# Patient Record
Sex: Female | Born: 1990 | Race: Black or African American | Hispanic: No | Marital: Single | Smoking: Former smoker
Health system: Southern US, Community
[De-identification: ages and names within clinical notes are randomized; demographics above are authoritative.]

## PROBLEM LIST (undated history)

## (undated) ENCOUNTER — Inpatient Hospital Stay (HOSPITAL_COMMUNITY): Payer: Self-pay

## (undated) DIAGNOSIS — F32A Depression, unspecified: Secondary | ICD-10-CM

## (undated) DIAGNOSIS — G8929 Other chronic pain: Secondary | ICD-10-CM

## (undated) DIAGNOSIS — M549 Dorsalgia, unspecified: Secondary | ICD-10-CM

## (undated) DIAGNOSIS — F319 Bipolar disorder, unspecified: Secondary | ICD-10-CM

## (undated) DIAGNOSIS — F329 Major depressive disorder, single episode, unspecified: Secondary | ICD-10-CM

## (undated) HISTORY — PX: TONSILLECTOMY: SUR1361

## (undated) HISTORY — PX: OTHER SURGICAL HISTORY: SHX169

---
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
## 2009-08-27 ENCOUNTER — Emergency Department (HOSPITAL_COMMUNITY): Admission: EM | Admit: 2009-08-27 | Discharge: 2009-08-27 | Payer: Self-pay | Admitting: Emergency Medicine

## 2009-10-27 ENCOUNTER — Emergency Department (HOSPITAL_COMMUNITY): Admission: EM | Admit: 2009-10-27 | Discharge: 2009-10-27 | Payer: Self-pay | Admitting: Emergency Medicine

## 2009-11-08 ENCOUNTER — Other Ambulatory Visit: Admission: RE | Admit: 2009-11-08 | Discharge: 2009-11-08 | Payer: Self-pay | Admitting: Obstetrics and Gynecology

## 2010-02-18 ENCOUNTER — Inpatient Hospital Stay (HOSPITAL_COMMUNITY): Admission: AD | Admit: 2010-02-18 | Discharge: 2010-02-18 | Payer: Self-pay | Admitting: Obstetrics & Gynecology

## 2010-04-04 ENCOUNTER — Inpatient Hospital Stay (HOSPITAL_COMMUNITY): Admission: RE | Admit: 2010-04-04 | Discharge: 2010-04-07 | Payer: Self-pay | Admitting: Obstetrics and Gynecology

## 2010-06-19 ENCOUNTER — Encounter: Payer: Self-pay | Admitting: Obstetrics

## 2010-08-09 LAB — CBC
HCT: 26.1 % — ABNORMAL LOW (ref 36.0–46.0)
Hemoglobin: 8.7 g/dL — ABNORMAL LOW (ref 12.0–15.0)
MCH: 27.9 pg (ref 26.0–34.0)
MCHC: 33.3 g/dL (ref 30.0–36.0)
MCV: 83.8 fL (ref 78.0–100.0)
Platelets: 148 10*3/uL — ABNORMAL LOW (ref 150–400)
RBC: 3.12 MIL/uL — ABNORMAL LOW (ref 3.87–5.11)
RDW: 14.6 % (ref 11.5–15.5)
WBC: 8.9 10*3/uL (ref 4.0–10.5)

## 2010-08-09 LAB — TYPE AND SCREEN
ABO/RH(D): A POS
Antibody Screen: NEGATIVE

## 2010-08-09 LAB — ABO/RH: ABO/RH(D): A POS

## 2010-08-10 LAB — CBC
HCT: 33.7 % — ABNORMAL LOW (ref 36.0–46.0)
Hemoglobin: 11.1 g/dL — ABNORMAL LOW (ref 12.0–15.0)
MCH: 27.6 pg (ref 26.0–34.0)
MCHC: 33 g/dL (ref 30.0–36.0)
MCV: 83.5 fL (ref 78.0–100.0)
Platelets: 187 10*3/uL (ref 150–400)
RBC: 4.03 MIL/uL (ref 3.87–5.11)
RDW: 13.9 % (ref 11.5–15.5)
WBC: 7 10*3/uL (ref 4.0–10.5)

## 2010-08-10 LAB — SURGICAL PCR SCREEN
MRSA, PCR: NEGATIVE
Staphylococcus aureus: NEGATIVE

## 2010-08-10 LAB — RPR: RPR Ser Ql: NONREACTIVE

## 2010-08-11 LAB — URINALYSIS, ROUTINE W REFLEX MICROSCOPIC
Bilirubin Urine: NEGATIVE
Glucose, UA: 100 mg/dL — AB
Hgb urine dipstick: NEGATIVE
Ketones, ur: 15 mg/dL — AB
Nitrite: NEGATIVE
Protein, ur: NEGATIVE mg/dL
Specific Gravity, Urine: >1.03 — ABNORMAL HIGH (ref 1.005–1.030)
Urobilinogen, UA: 2 mg/dL — ABNORMAL HIGH (ref 0.0–1.0)
pH: 6 (ref 5.0–8.0)

## 2010-08-11 LAB — URINE MICROSCOPIC-ADD ON

## 2010-08-11 LAB — GC/CHLAMYDIA PROBE AMP, GENITAL
Chlamydia, DNA Probe: NEGATIVE
GC Probe Amp, Genital: NEGATIVE

## 2010-08-11 LAB — FETAL FIBRONECTIN: Fetal Fibronectin: NEGATIVE

## 2010-08-11 LAB — WET PREP, GENITAL
Clue Cells Wet Prep HPF POC: NONE SEEN
Trich, Wet Prep: NONE SEEN
Yeast Wet Prep HPF POC: NONE SEEN

## 2010-08-15 LAB — URINE MICROSCOPIC-ADD ON

## 2010-08-15 LAB — CBC
HCT: 34.5 % — ABNORMAL LOW (ref 36.0–46.0)
Hemoglobin: 11.5 g/dL — ABNORMAL LOW (ref 12.0–15.0)
MCHC: 33.3 g/dL (ref 30.0–36.0)
MCV: 85.6 fL (ref 78.0–100.0)
Platelets: 216 10*3/uL (ref 150–400)
RBC: 4.03 MIL/uL (ref 3.87–5.11)
RDW: 13.3 % (ref 11.5–15.5)
WBC: 6.4 10*3/uL (ref 4.0–10.5)

## 2010-08-15 LAB — DIFFERENTIAL
Basophils Absolute: 0 10*3/uL (ref 0.0–0.1)
Basophils Relative: 1 % (ref 0–1)
Eosinophils Absolute: 0.3 10*3/uL (ref 0.0–0.7)
Eosinophils Relative: 4 % (ref 0–5)
Lymphocytes Relative: 26 % (ref 12–46)
Lymphs Abs: 1.7 10*3/uL (ref 0.7–4.0)
Monocytes Absolute: 0.4 10*3/uL (ref 0.1–1.0)
Monocytes Relative: 6 % (ref 3–12)
Neutro Abs: 4.1 10*3/uL (ref 1.7–7.7)
Neutrophils Relative %: 64 % (ref 43–77)

## 2010-08-15 LAB — POCT PREGNANCY, URINE: Preg Test, Ur: POSITIVE

## 2010-08-15 LAB — COMPREHENSIVE METABOLIC PANEL
ALT: 15 U/L (ref 0–35)
AST: 19 U/L (ref 0–37)
Albumin: 3.2 g/dL — ABNORMAL LOW (ref 3.5–5.2)
Alkaline Phosphatase: 49 U/L (ref 39–117)
BUN: 8 mg/dL (ref 6–23)
CO2: 25 mEq/L (ref 19–32)
Calcium: 9.1 mg/dL (ref 8.4–10.5)
Chloride: 108 mEq/L (ref 96–112)
Creatinine, Ser: 0.59 mg/dL (ref 0.4–1.2)
GFR calc Af Amer: >60 mL/min (ref >60)
GFR calc non Af Amer: >60 mL/min (ref >60)
Glucose, Bld: 86 mg/dL (ref 70–99)
Potassium: 3.9 mEq/L (ref 3.5–5.1)
Sodium: 138 mEq/L (ref 135–145)
Total Bilirubin: 0.6 mg/dL (ref 0.3–1.2)
Total Protein: 6.3 g/dL (ref 6.0–8.3)

## 2010-08-15 LAB — URINALYSIS, ROUTINE W REFLEX MICROSCOPIC
Bilirubin Urine: NEGATIVE
Glucose, UA: NEGATIVE mg/dL
Ketones, ur: NEGATIVE mg/dL
Leukocytes, UA: NEGATIVE
Nitrite: NEGATIVE
Protein, ur: NEGATIVE mg/dL
Specific Gravity, Urine: 1.03 (ref 1.005–1.030)
Urobilinogen, UA: 0.2 mg/dL (ref 0.0–1.0)
pH: 5.5 (ref 5.0–8.0)

## 2010-08-15 LAB — HCG, QUANTITATIVE, PREGNANCY: hCG, Beta Chain, Quant, S: 17526 m[IU]/mL — ABNORMAL HIGH (ref <5)

## 2010-08-15 LAB — URINE CULTURE: Colony Count: >=100000

## 2010-08-15 LAB — LIPASE, BLOOD: Lipase: 25 U/L (ref 11–59)

## 2010-08-15 LAB — ABO/RH: ABO/RH(D): A POS

## 2010-08-15 LAB — GC/CHLAMYDIA PROBE AMP, GENITAL
Chlamydia, DNA Probe: NEGATIVE
GC Probe Amp, Genital: NEGATIVE

## 2010-10-11 NOTE — Discharge Summary (Signed)
NAMEDESSIRE, GRIMES NO.:  [id number]   MEDICAL RECORD NO.:  [id number]          PATIENT TYPE:  INP   LOCATION:  9318                          FACILITY:  [facility]   PHYSICIAN:  Tanya S. Shawnie Pons, M.D.   DATE OF BIRTH:  [birth year]   DATE OF ADMISSION:  01/29/2008  DATE OF DISCHARGE:  02/01/2008                               DISCHARGE SUMMARY   FINAL DIAGNOSES:  1. Preterm labor.  2. Chorioamnionitis.  3. Postpartum endometritis.  4. Anemia.   PERTINENT PROCEDURES:  Repeat low transverse cesarean section secondary  to nonreassuring fetal heart rate tracing and chorioamnionitis.   PERTINENT LABORATORY:  Preoperative hemoglobin of 10.4 and postoperative  hemoglobin of 8.7 home.  RPR is nonreactive.   REASON FOR ADMISSION:  Briefly, the patient is an 20 year old gravida 2,  para 1 had previous C-section for arrest of dilation who was for a total  lack who was admitted at 35 weeks' gestation with preterm labor.   HOSPITAL COURSE:  The patient was admitted to labor and delivery.  She  develops chorioamnionitis relatively soon after this with fever of 102.  The patient was 6 cm, 90% effaced and -1 at the time.  The patient had  repetitive severe variable decelerations was remote from delivery and  was taken for a repeat C-section.  C-section was uneventful and the  patient delivered a viable female infant that was taken to the NICU  secondary to prematurity and fever during labor.  Postoperatively, she  was transferred to the third floor where she had an uneventful  postoperative course.  Because the patient fever, she was continued on  antibiotics and she had a low grade temperature of 100.1 on  postoperative day #1, but was afebrile for 24 hours prior to discharge.  The patient was breast pumping and was doing well with that.  She was  ambulating, voiding without difficulty.  She was tolerating p.o. and had  passed flatus.  Because the patient was so stable so she was  stable for  discharge.   DISCHARGE DISPOSITION AND CONDITION:  The patient is discharged home in  good condition.  Followup will be in 6 weeks with the health department.  She will use Depo-Provera for contraception.  She is status post this  shot prior to discharge.   DISCHARGE MEDICATIONS:  1. Percocet 5/325 1-2 p.o. q.4-6 hours p.r.n. pain #40.  2. Given ibuprofen 600 mg every 6 hours p.r.n. pain.  3. Colace 100 mg twice daily.  4. Iron sulfate 325 mg twice daily.  5. She will continue prenatal vitamins, though she has a problem.      Shelbie Proctor. Shawnie Pons, M.D.  Electronically Signed     TSP/MEDQ  D:  02/01/2008  T:  02/01/2008  Job:  [id number]

## 2010-10-11 NOTE — Op Note (Signed)
[name], BYE NO.:  [id number]   MEDICAL RECORD NO.:  [id number]           PATIENT TYPE:   LOCATION:                                 FACILITY:   PHYSICIAN:  Allie Bossier, MD        DATE OF BIRTH:  August 29, 1990   DATE OF PROCEDURE:  DATE OF DISCHARGE:                               OPERATIVE REPORT   PREOPERATIVE DIAGNOSES:  1. Thirty-four plus weeks active labor.  2. Nonreassuring fetal heart rate.  3. Remote from delivery.  4. Previous cesarean section.  5. Chorioamnionitis.   POSTOPERATIVE DIAGNOSES:  1. Thirty-four plus weeks active labor.  2. Nonreassuring fetal heart rate.  3. Remote from delivery.  4. Previous cesarean section.  5. Chorioamnionitis.   PROCEDURE:  Repeat low transverse cesarean section.   SURGEON:  Allie Bossier, MD   ANESTHESIA:  Epidural, Angelica Pou, MD   COMPLICATIONS:  None.   ESTIMATED BLOOD LOSS:  500 mL.   SPECIMENS:  Cord blood and placenta.   FINDINGS:  1. Living female infant with Apgars of 5 and 7 at one and five      minutes, weight pending.  2. Intact placenta with three-vessel cord.  3. Normal adnexa.  4. Loose nuchal cord.   DETAIL OF PROCEDURE AND FINDINGS:  Risks, benefits, and alternatives of  the surgery was explained, understood, and accepted, consents were  signed.  She was taken to the operating room where epidural was bolused  for surgery.  Her abdomen was prepped and draped in usual sterile  fashion.  She was placed in a dorsal supine position with left lateral  tilt.  After adequate anesthesia was assured, a transverse incision was  made at the site of her previous incision.  Incision was carried down  through the subcutaneous tissue to the fascia.  The fascia was scored in  midline.  There was a marked amount of scarring.  At this point,  carefully open the fascia and the rectus muscles were partially  separated in a transverse fashion using electrosurgical technique.  Peritoneum was  entered with hemostats.  Peritoneal incision was extended  bilaterally with the Bovie taking care to avoid the bladder.  The  bladder blade was placed.  The lower uterine segment was noted to be  intact and yet a very attenuated and bulging outwards.  Transverse  incision was made at this site.  Amniotomy was performed with hemostats,  slightly bloody fluid was noted.  Nuchal cord x1 was reduced.  A Kiwi  vacuum extractor was used to assist delivery of the head with one gentle  pull.  The mouth and nostrils were suctioned prior to delivery of the  shoulders.  The cord was clamped and cut and the baby was transferred to  the pediatrician for routine care.  The weight and Apgars are listed  above.      Allie Bossier, MD  Electronically Signed     MCD/MEDQ  D:  01/29/2008  T:  01/30/2008  Job:  [id number]

## 2010-10-11 NOTE — Op Note (Signed)
[name], [name] NO.:  [id number]   MEDICAL RECORD NO.:  [id number]          PATIENT TYPE:  INP   LOCATION:  9114                          FACILITY:  [facility]   PHYSICIAN:  Charles A. Clearance Coots, M.D.DATE OF BIRTH:  15-Jun-1990   DATE OF PROCEDURE:  04/18/2007  DATE OF DISCHARGE:                               OPERATIVE REPORT   PREOPERATIVE DIAGNOSIS:  Induction of early labor, late fetal heart rate  decelerations.   POSTOPERATIVE DIAGNOSIS:  Induction of early labor, late fetal heart  rate decelerations.   PROCEDURE:  Primary low transverse cesarean section.   SURGEON:  Charles A. Clearance Coots, M.D.   ASSISTANT:  Kathlee Nations, CST   ANESTHESIA:  Epidural.   ESTIMATED BLOOD LOSS:  700 mL.   IV FLUIDS:  650 mL.   URINE OUTPUT:  125 mL.   COMPLICATIONS:  None.   DRAINS:  Foley to gravity   SPECIMEN:  Placenta to pathology.   FINDINGS:  Viable female at 11:04, Apgars of 8 at 1 minute and 9 at 5  minutes, weight of 7 pounds 14 ounces.  Cord pH of 7.19.  Normal uterus,  ovaries, and fallopian tubes.   OPERATION:  The patient was brought to the operating room. After  satisfactory redosing of the epidural, the abdomen was prepped and  draped in the usual sterile fashion.  A Pfannenstiel skin incision was  made with the scalpel that was deepened down to the fascia with the  scalpel.  The fascia was nicked in the midline and the fascial incision  was extended to the left and to the right with curved Mayo scissors.  The superior and inferior fascial edges were taken off of the rectus  muscles using blunt and sharp dissection.  The rectus muscles were  bluntly and sharply divided in the midline.  The peritoneum was entered  digitally and was digitally extended to the left and to the right.  The  bladder blade was positioned and the vesicouterine fold of peritoneum  above the reflection of the urinary bladder was grasped with forceps and  was incised and  undermined with Metzenbaum scissors.  The incision was  extended to the left and to the right with Metzenbaum scissors.  The  bladder flap was developed and the bladder blade was repositioned in  front of the urinary bladder placing it well out of the operative field.  The uterus was then entered transversely in the lower uterine segment  with a scalpel.  Clear amniotic fluid was expelled.  The uterine  incision was extended to the left and to the right digitally and the  vertex was noted to be left occiput transverse.  The occiput was rotated  into the incision and was not able to flex adequately and vacuum  extraction with the Mityvac mushroom vacuum was applied to the occiput  and the occiput was then flexed and the vertex was delivered with the  aid of fundal pressure from the assistant and vacuum extraction with  just two pulls.  The infant's mouth and nose were suctioned with a  suction bulb and delivery was completed with  the aid of fundal pressure  from the assistant.  The umbilical cord was doubly clamped and cut and  the infant was handed off to the nursery staff.  The placenta was then  spontaneously expelled from the uterine cavity intact.  The endometrial  surface was thoroughly debrided with a dry lap sponge. The edges of the  uterine incision were grasped with ring forceps.  The uterus was closed  with a continuous interlocking suture of 0 Monocryl.  Hemostasis was  excellent. The bladder flap was closed with a continuous suture of 3-0  Monocryl.  The pelvic cavity was thoroughly irrigated with warm saline  solution.  All clots were removed.  The abdomen was then closed as  follows.  The peritoneum was closed with continuous suture of 2-0  Monocryl.  The fascia was closed with a continuous suture of 0 Vicryl.  The subcutaneous tissue was thoroughly irrigated with warm saline  solution.  All areas of subcutaneous bleeding were coagulated with the  Bovie.  The skin was then  closed with stainless steel staples.  A  sterile bandage was applied to the incision closure.  The surgical  technician indicated that all needle, sponge and instrument counts were  correct x2.  The patient tolerated the procedure well and was  transported to the recovery room in satisfactory condition.      Charles A. Clearance Coots, M.D.  Electronically Signed     CAH/MEDQ  D:  04/18/2007  T:  04/18/2007  Job:  [id number]

## 2010-10-14 NOTE — Discharge Summary (Signed)
[name], [name] NO.:  [id number]   MEDICAL RECORD NO.:  [id number]          PATIENT TYPE:  INP   LOCATION:  9114                          FACILITY:  [facility]   PHYSICIAN:  Roseanna Rainbow, M.D.DATE OF BIRTH:  1991/05/06   DATE OF ADMISSION:  04/17/2007  DATE OF DISCHARGE:  04/21/2007                               DISCHARGE SUMMARY   CHIEF COMPLAINT:  The patient is a 20 year old gravida 1 African-  American female with an estimated date of confinement of April 18, 2007, now complaining of decreased fetal movement.   HISTORY OF PRESENT ILLNESS:  Please see the above.  A nonstress test was  nonreactive.  On sterile vaginal exam, the Bishop score is favorable.  The patient also reported uterine contractions.   PAST SURGICAL HISTORY:  She denies.   PAST MEDICAL HISTORY:  She denies.   MEDICATIONS:  Prenatal vitamins.   SOCIAL HISTORY:  She denies any tobacco, ethanol or drug use.   FAMILY HISTORY:  Hypertension, diabetes.   PHYSICAL EXAMINATION:  VITAL SIGNS:  Blood pressure 120/78.  LUNGS:  Clear to auscultation.  HEART:  Regular rate and rhythm.  ABDOMEN:  Gravid, nontender.  GU:  Sterile vaginal exam, cervix is 2 cm dilated, 70% effaced with  vertex at -1 station.   ASSESSMENT/PLAN:  Intrauterine pregnancy at 39+ weeks with decreased  fetal movement and a nonreactive nonstress test, favorable Bishop score.   PLAN:  Admission, Pitocin, augmentation of labor.   HOSPITAL COURSE:  The patient was admitted.  She was started on Pitocin  per the low-dose protocol.  She progressed to 5 cm of dilatation at  which point there was a protraction of the active phase of labor.  The  fetal heart tracing had developed late decelerations.  The decision at  this point was made to proceed with a cesarean delivery.  Please see the  dictated operative summary.  On postoperative day 1, her hemoglobin 8.  She was hemodynamically stable.  The remainder of her  hospital course  was uneventful.  She was discharged to home on postoperative day 3.   DISCHARGE DIAGNOSIS:  Intrauterine pregnancy at term, suspicious fetal  heart tracing.   PROCEDURE:  Cesarean delivery.   CONDITION:  Good.   DIET:  Regular.   ACTIVITY:  Pelvic rest, progressive activity.   DISCHARGE MEDICATIONS:  Percocet.   FOLLOW UP:  The patient was to follow up in the office in 6 weeks.      Roseanna Rainbow, M.D.  Electronically Signed     LAJ/MEDQ  D:  05/16/2007  T:  05/16/2007  Job:  [id number]

## 2010-10-14 NOTE — Op Note (Signed)
NAME:  JOHNEISHA, BROADEN NO.:  [id number]   MEDICAL RECORD NO.:  [id number]                  PATIENT TYPE:   LOCATION:                                       FACILITY:   PHYSICIAN:  Karol T. Lazarus Salines, M.D.              DATE OF BIRTH:  05/09/1991   DATE OF PROCEDURE:  06/30/2002  DATE OF DISCHARGE:                                 OPERATIVE REPORT   PREOPERATIVE DIAGNOSES:  1. Obstructive tonsillar hypertrophy.  2. Obstructive turbinate hypertrophy bilaterally.   POSTOPERATIVE DIAGNOSES:  1. Obstructive tonsillar hypertrophy.  2. Obstructive turbinate hypertrophy bilaterally.  3. Obstructive adenoid hypertrophy.   PROCEDURE:  Tonsillectomy, adenoidectomy, bilateral submucous resection of  inferior turbinates.   SURGEON:  Gloris Manchester. Lazarus Salines, M.D.   ANESTHESIA:  General orotracheal.   ESTIMATED BLOOD LOSS:  25 cubic centimeters.   COMPLICATIONS:  None.   FINDINGS:  Tonsils 4+, normal soft palate, 80% obstructive adenoid pad,  large bony and soft tissue hypertrophy of the inferior turbinates with  obstruction bilaterally.   DESCRIPTION OF PROCEDURE:  With the patient in a comfortable supine  position, general orotracheal anesthesia was induced without difficulty.  At  an appropriate level, the patient was placed in a slight sitting position  and the table was rotated.  A saline-moistened throat pack was placed.  Cocaine 4% solution, 160 mg total was applied on a 1/2 x 3 cottonoids to  both sides of the nose around the inferior turbinates.  Xylocaine 1% with  1:100,000 epinephrine, 10 cubic centimeters total was infiltrated into the  submucosa of the inferior turbinates on both sides.  Several minutes were  allowed for hemostasis and anesthesia to take effect.  The throat was inspected using a Weder tongue retractor and 1/2% Xylocaine  with 1:200,000 epinephrine was infiltrated into the peritonsillar planes on  both sides, 10 cubic centimeters  total for intraoperative hemostasis.  After allowing adequate time, the materials were removed from the right side  of the nose and observed to be intact and correct in number.  The findings  were as described above.  The anterior hood of the inferior turbinate was  lysed just behind the nasal valve region.  The medial mucosa of the middle  turbinate was incised in an anterior up-sloping fashion, and a laterally-  based flap was developed.  The turbinate was in-fractured.  Using angled  turbinate scissors, the turbinate bone and lateral mucosa was excised in a  posterior down-sloping fashion, taking virtually all of the anterior pole  and leaving virtually all of the posterior pole.  The bone was very heavy  and was carefully dissected and submucosally resected.  The cut edges were  suction-coagulated.  The flap was laid back down.  The turbinate was out-  fractured and the right side was completed.  The left side was done in an  identical fashion.  Upon completion of both sides of the nose, the quadruple  thickness Neosporin-impregnated Telfa pack was placed for pressure for  hemostasis in each side of the nose.  This completed the nasal portion of  the procedure.  The patient was now placed in Trendelenburg.  The pharynx was suctioned free  and the throat pack was removed, taking care to protect lips, teeth, and  endotracheal tube.  The Crowe-Davis mouth gag was introduced, expanded for  visualization, and suspended from the Mayo stand in the standard fashion.  The blade was adjusted several times for adequate fit and finally a #4 flat  blade was used.  The findings were as described above.  The palate and  mirror were used to visualize the nasopharynx, with the findings as  described above.  The decision was made to include adenoidectomy.  The adenoids were removed from the nasopharynx using sharp adenoid curets  and several passes.  The tissue was carefully removed and the pharynx was   suctioned dry and packed with saline-moistened tonsillar sponges for  hemostasis.  Beginning on the left side, the tonsil was grasped and retracted medially.  The mucosa overlying the anterior and superior poles was coagulated and then  cut down to the capsule of the tonsil.  Using the cautery tip as a blunt  dissector, coagulating crossing vessels and lysing fibrous bands as  identified, the tonsil was dissected free of its muscular fossa from  superiorly downward.  The tonsil was removed in its entirety as determined  by examination of both tonsil and fossa.  A small additional quantity of  cautery rendered the fossa hemostatic.  After completing the left  tonsillectomy, the right side was done in an identical fashion.  After completing both tonsillectomies and rendering the oropharynx  hemostatic, the nasopharynx was unpacked.  The red rubber catheter was  passed through the nose to serve as a palate retractor.  Using suction  cautery and indirect visualization, adenoid tags in the choana were ablated,  modest lateral bands were ablated, and finally the adenoid bed proper was  coagulated for hemostasis.  This was done in several passes using irrigation  to accurately localize the bleeding sites.  Upon achieving hemostasis in the  nasopharynx, the oropharynx was again observed to be hemostatic.  At this  point the palate retractor and mouth gag were relaxed for several minutes.  Upon re-expansion, hemostasis was persistent.  At this point the procedure  was completed.  The palate and mouth gag were relaxed and removed.  The  dental status was then packed.  The patient was returned to anesthesia, awakened, extubated, and transferred  to the recovery room in stable condition.   COMMENTS:  A 20 year old black female with completely obstructed nose,  secondary to hypertrophic turbinates, failing to respond on medical management, as well as very large tonsils and adenoids, snoring, mouth   breathing, and early sleep apnea, were the several indications for today's  procedure.  Anticipated routine postoperative recovery with analgesia, antibiosis,  hydration, and observation for bleeding, emesis, or airway compromise.  With  regards to the nose, we will use an ice pack, drip pad, and remove the  packing in two days.                                                Gloris Manchester. Lazarus Salines, M.D.    KTW/MEDQ  D:  06/30/2002  T:  06/30/2002  Job:  [id number]   cc:   [facility]

## 2011-02-23 LAB — POCT URINALYSIS DIP (DEVICE)
Bilirubin Urine: NEGATIVE
Glucose, UA: NEGATIVE
Hgb urine dipstick: NEGATIVE
Ketones, ur: NEGATIVE
Nitrite: POSITIVE — AB
Operator id: 120861
Protein, ur: NEGATIVE
Specific Gravity, Urine: 1.025
Urobilinogen, UA: 2 — ABNORMAL HIGH
pH: 6.5

## 2011-02-24 LAB — POCT URINALYSIS DIP (DEVICE)
Bilirubin Urine: NEGATIVE
Glucose, UA: NEGATIVE
Hgb urine dipstick: NEGATIVE
Ketones, ur: NEGATIVE
Nitrite: NEGATIVE
Operator id: 297281
Protein, ur: 30 — AB
Specific Gravity, Urine: 1.02
Urobilinogen, UA: 4 — ABNORMAL HIGH
pH: 7

## 2011-02-24 LAB — FETAL FIBRONECTIN: Fetal Fibronectin: NEGATIVE

## 2011-03-01 LAB — CBC
HCT: 26.8 — ABNORMAL LOW
HCT: 32.3 — ABNORMAL LOW
Hemoglobin: 10.4 — ABNORMAL LOW
Hemoglobin: 8.7 — ABNORMAL LOW
MCHC: 32.1
MCHC: 32.4
MCV: 78.4
MCV: 78.4
Platelets: 192
Platelets: 246
RBC: 3.42 — ABNORMAL LOW
RBC: 4.12
RDW: 15.8 — ABNORMAL HIGH
RDW: 15.9 — ABNORMAL HIGH
WBC: 15.6 — ABNORMAL HIGH
WBC: 16.6 — ABNORMAL HIGH

## 2011-03-01 LAB — RPR: RPR Ser Ql: NONREACTIVE

## 2011-03-07 LAB — CBC
HCT: 24.5 — ABNORMAL LOW
HCT: 33.8 — ABNORMAL LOW
HCT: 34.8 — ABNORMAL LOW
Hemoglobin: 11.4 — ABNORMAL LOW
Hemoglobin: 11.4 — ABNORMAL LOW
Hemoglobin: 8.1 — ABNORMAL LOW
MCHC: 32.8
MCHC: 33.1
MCHC: 33.8
MCV: 83.6
MCV: 83.8
MCV: 83.8
Platelets: 204
Platelets: 221
Platelets: 225
RBC: 2.92 — ABNORMAL LOW
RBC: 4.04
RBC: 4.15
RDW: 14.7
RDW: 14.8
RDW: 14.8
WBC: 11
WBC: 6.7
WBC: 6.9

## 2011-03-07 LAB — RPR
RPR Ser Ql: NONREACTIVE
RPR Ser Ql: NONREACTIVE

## 2011-03-07 LAB — RUBELLA SCREEN: Rubella: 14 — ABNORMAL HIGH

## 2011-03-14 LAB — URINE MICROSCOPIC-ADD ON

## 2011-03-14 LAB — URINE CULTURE: Colony Count: 25000

## 2011-03-14 LAB — URINALYSIS, ROUTINE W REFLEX MICROSCOPIC
Bilirubin Urine: NEGATIVE
Glucose, UA: NEGATIVE
Hgb urine dipstick: NEGATIVE
Ketones, ur: NEGATIVE
Nitrite: NEGATIVE
Protein, ur: NEGATIVE
Specific Gravity, Urine: 1.025
Urobilinogen, UA: 1
pH: 6.5

## 2011-07-05 ENCOUNTER — Emergency Department (HOSPITAL_COMMUNITY)
Admission: EM | Admit: 2011-07-05 | Discharge: 2011-07-06 | Disposition: A | Discharge status: Other Acute Inpatient Hospital | Payer: 59 | Source: Home / Self Care | Attending: Emergency Medicine | Admitting: Emergency Medicine

## 2011-07-05 ENCOUNTER — Encounter (HOSPITAL_COMMUNITY): Payer: Self-pay | Admitting: *Deleted

## 2011-07-05 ENCOUNTER — Other Ambulatory Visit: Payer: Self-pay

## 2011-07-05 DIAGNOSIS — T43502A Poisoning by unspecified antipsychotics and neuroleptics, intentional self-harm, initial encounter: Secondary | ICD-10-CM | POA: Insufficient documentation

## 2011-07-05 DIAGNOSIS — R51 Headache: Secondary | ICD-10-CM | POA: Insufficient documentation

## 2011-07-05 DIAGNOSIS — T43501A Poisoning by unspecified antipsychotics and neuroleptics, accidental (unintentional), initial encounter: Secondary | ICD-10-CM | POA: Insufficient documentation

## 2011-07-05 DIAGNOSIS — T50902A Poisoning by unspecified drugs, medicaments and biological substances, intentional self-harm, initial encounter: Secondary | ICD-10-CM

## 2011-07-05 DIAGNOSIS — Z79899 Other long term (current) drug therapy: Secondary | ICD-10-CM | POA: Insufficient documentation

## 2011-07-05 DIAGNOSIS — R112 Nausea with vomiting, unspecified: Secondary | ICD-10-CM | POA: Insufficient documentation

## 2011-07-05 DIAGNOSIS — F17200 Nicotine dependence, unspecified, uncomplicated: Secondary | ICD-10-CM | POA: Insufficient documentation

## 2011-07-05 DIAGNOSIS — Y92009 Unspecified place in unspecified non-institutional (private) residence as the place of occurrence of the external cause: Secondary | ICD-10-CM | POA: Insufficient documentation

## 2011-07-05 LAB — CBC
HCT: 39.4 % (ref 36.0–46.0)
Hemoglobin: 12.8 g/dL (ref 12.0–15.0)
MCH: 26.9 pg (ref 26.0–34.0)
MCHC: 32.5 g/dL (ref 30.0–36.0)
MCV: 82.8 fL (ref 78.0–100.0)
Platelets: 329 10*3/uL (ref 150–400)
RBC: 4.76 MIL/uL (ref 3.87–5.11)
RDW: 12.5 % (ref 11.5–15.5)
WBC: 11.4 10*3/uL — ABNORMAL HIGH (ref 4.0–10.5)

## 2011-07-05 MED ORDER — ONDANSETRON HCL 4 MG/2ML IJ SOLN
4.0000 mg | Freq: Once | INTRAMUSCULAR | Status: AC
  Administered 2011-07-05: 4 mg via INTRAVENOUS
  Filled 2011-07-05: qty 2

## 2011-07-05 MED ORDER — SODIUM CHLORIDE 0.9 % IV SOLN
INTRAVENOUS | Status: DC
  Administered 2011-07-05: via INTRAVENOUS

## 2011-07-05 NOTE — ED Notes (Addendum)
Pt states she took something to help her sleep.  States she isn't sure what she took, and that they weren't hers. Just "something I found in the dresser".  Pt states she doesn't know who they belong to or what time she took them.  Per pt's mother, she thinks pt took 8 of her Seroquel, but isn't sure.  Pt denies trying to harm herself, she "just wanted to sleep."  Pt very groggy upon arrival to ED.  Does arouse and interact.

## 2011-07-05 NOTE — ED Provider Notes (Addendum)
This chart was scribed for Sunnie Nielsen, MD by Wallis Mart. The patient was seen in room APA07/APA07 and the patient's care was started at 11:32 PM.   CSN: [id number]  Arrival date & time 07/05/11  2232   First MD Initiated Contact with Patient 07/05/11 2306      No chief complaint on file.   (Consider location/radiation/quality/duration/timing/severity/associated sxs/prior treatment) HPI  [name] is a 21 y.o. female who presents to the Emergency Department complaining of an intentional drug overdose occurring around 10:15 PM tonight. Pt took 8 pills, but did not know what they were.  Pt states she "just wanted to go to sleep", and has never done something like this before.  Mother thinks pt took Serquil (400 mg).  Pt c/o associated nausea and vomiting, head ache.  Pt denies drinking alcohol or taking any other drugs. Pt with no h/o depression. Symptoms moderate in severity. Patient with nausea and vomiting times once in the emergency department. She feels sleepy.  History reviewed. No pertinent past medical history.  Past Surgical History  Procedure Date  . Cesarean section     History reviewed. No pertinent family history.  History  Substance Use Topics  . Smoking status: Current Everyday Smoker  . Smokeless tobacco: Not on file  . Alcohol Use: No    OB History    Grav Para Term Preterm Abortions TAB SAB Ect Mult Living                  Review of Systems  Constitutional: Negative for fever and chills.  HENT: Negative for neck pain and neck stiffness.   Eyes: Negative for pain.  Respiratory: Negative for shortness of breath.   Cardiovascular: Negative for chest pain.  Gastrointestinal: Negative for abdominal pain.  Genitourinary: Negative for dysuria.  Musculoskeletal: Negative for back pain.  Skin: Negative for rash.  Neurological: Negative for seizures, syncope and numbness.  Psychiatric/Behavioral: Negative for hallucinations and self-injury.  All  other systems reviewed and are negative.   10 Systems reviewed and are negative for acute change except as noted in the HPI.  Allergies  Review of patient's allergies indicates no known allergies.  Home Medications   Current Outpatient Rx  Name Route Sig Dispense Refill  . ETONOGESTREL 68 MG [place] IMPL Subcutaneous Inject 1 each into the skin once.      BP 129/68  Pulse 98  Temp(Src) 99 F (37.2 C) (Oral)  Resp 16  Ht 5\' 4"  (1.626 m)  Wt 175 lb (79.379 kg)  BMI 30.04 kg/m2  SpO2 100%  Physical Exam  Nursing note and vitals reviewed. Constitutional: She is oriented to person, place, and time. She appears well-developed and well-nourished. No distress.  HENT:  Head: Normocephalic and atraumatic.       Moist mucus membranes  Eyes: EOM are normal. Pupils are equal, round, and reactive to light.       No nystagmus  Neck: Normal range of motion. Neck supple. No tracheal deviation present.  Cardiovascular: Normal rate and regular rhythm.   Pulmonary/Chest: Effort normal and breath sounds normal. No respiratory distress.  Abdominal: Soft. Bowel sounds are normal. She exhibits no distension.  Musculoskeletal: Normal range of motion. She exhibits no edema.  Neurological: She is alert and oriented to person, place, and time. No sensory deficit.  Skin: Skin is warm and dry.  Psychiatric: Her behavior is normal.       Depressed and flat affect    ED Course  Procedures (  including critical care time) DIAGNOSTIC STUDIES: Oxygen Saturation is 100% on room air, normal by my interpretation.    COORDINATION OF CARE:   Results for orders placed during the hospital encounter of 07/05/11  ACETAMINOPHEN LEVEL      Component Value Range   Acetaminophen (Tylenol), Serum <15.0  10 - 30 (ug/mL)  SALICYLATE LEVEL      Component Value Range   Salicylate Lvl 0.2 (*) 2.8 - 20.0 (mg/dL)  CBC      Component Value Range   WBC 11.4 (*) 4.0 - 10.5 (K/uL)   RBC 4.76  3.87 - 5.11 (MIL/uL)    Hemoglobin 12.8  12.0 - 15.0 (g/dL)   HCT 16.1  09.6 - 04.5 (%)   MCV 82.8  78.0 - 100.0 (fL)   MCH 26.9  26.0 - 34.0 (pg)   MCHC 32.5  30.0 - 36.0 (g/dL)   RDW 40.9  81.1 - 91.4 (%)   Platelets 329  150 - 400 (K/uL)  COMPREHENSIVE METABOLIC PANEL      Component Value Range   Sodium 141  135 - 145 (mEq/L)   Potassium 3.5  3.5 - 5.1 (mEq/L)   Chloride 107  96 - 112 (mEq/L)   CO2 22  19 - 32 (mEq/L)   Glucose, Bld 120 (*) 70 - 99 (mg/dL)   BUN 13  6 - 23 (mg/dL)   Creatinine, Ser 7.82  0.50 - 1.10 (mg/dL)   Calcium 95.6  8.4 - 10.5 (mg/dL)   Total Protein 8.0  6.0 - 8.3 (g/dL)   Albumin 4.6  3.5 - 5.2 (g/dL)   AST 26  0 - 37 (U/L)   ALT 31  0 - 35 (U/L)   Alkaline Phosphatase 70  39 - 117 (U/L)   Total Bilirubin 0.5  0.3 - 1.2 (mg/dL)   GFR calc non Af Amer >90  >90 (mL/min)   GFR calc Af Amer >90  >90 (mL/min)  ETHANOL      Component Value Range   Alcohol, Ethyl (B) <11  0 - 11 (mg/dL)   ACT involved for ER evaluation. Psych holding orders initiated.  Poison control notified. Patient with nausea and vomiting and somewhat sleepy his aspiration risk and charcoal being held at this time. Plan observation. IV fluids and Zofran provided. Labs reviewed as above.    Date: 07/06/2011  Rate: 96  Rhythm: normal sinus rhythm  QRS Axis: normal  Intervals: normal  ST/T Wave abnormalities: normal and nonspecific ST changes  Conduction Disutrbances:none  Narrative Interpretation: QRS 78  Old EKG Reviewed: none available   MDM  Intentional overdose. Patient voluntary at this time. Sitter bedside. plan psych dispo.   I personally performed the services described in this documentation, which was scribed in my presence. The recorded information has been reviewed and considered.         Sunnie Nielsen, MD 07/06/11 2130  Sunnie Nielsen, MD 07/06/11 [phone]

## 2011-07-06 ENCOUNTER — Inpatient Hospital Stay (HOSPITAL_COMMUNITY)
Admission: AD | Admit: 2011-07-06 | Discharge: 2011-07-10 | DRG: 885 | Disposition: A | Discharge status: Home / Self Care | Payer: 59 | Source: Ambulatory Visit | Attending: Psychiatry | Admitting: Psychiatry

## 2011-07-06 ENCOUNTER — Encounter (HOSPITAL_COMMUNITY): Payer: Self-pay

## 2011-07-06 DIAGNOSIS — Z79899 Other long term (current) drug therapy: Secondary | ICD-10-CM

## 2011-07-06 DIAGNOSIS — F339 Major depressive disorder, recurrent, unspecified: Principal | ICD-10-CM | POA: Diagnosis present

## 2011-07-06 DIAGNOSIS — IMO0002 Reserved for concepts with insufficient information to code with codable children: Secondary | ICD-10-CM

## 2011-07-06 DIAGNOSIS — F17200 Nicotine dependence, unspecified, uncomplicated: Secondary | ICD-10-CM

## 2011-07-06 DIAGNOSIS — F4320 Adjustment disorder, unspecified: Secondary | ICD-10-CM

## 2011-07-06 DIAGNOSIS — Z87898 Personal history of other specified conditions: Secondary | ICD-10-CM

## 2011-07-06 DIAGNOSIS — T50901A Poisoning by unspecified drugs, medicaments and biological substances, accidental (unintentional), initial encounter: Secondary | ICD-10-CM

## 2011-07-06 DIAGNOSIS — N390 Urinary tract infection, site not specified: Secondary | ICD-10-CM

## 2011-07-06 DIAGNOSIS — T50902A Poisoning by unspecified drugs, medicaments and biological substances, intentional self-harm, initial encounter: Secondary | ICD-10-CM

## 2011-07-06 DIAGNOSIS — Z9151 Personal history of suicidal behavior: Secondary | ICD-10-CM | POA: Diagnosis present

## 2011-07-06 LAB — COMPREHENSIVE METABOLIC PANEL
ALT: 31 U/L (ref 0–35)
AST: 26 U/L (ref 0–37)
Albumin: 4.6 g/dL (ref 3.5–5.2)
Alkaline Phosphatase: 70 U/L (ref 39–117)
BUN: 13 mg/dL (ref 6–23)
CO2: 22 mEq/L (ref 19–32)
Calcium: 10.5 mg/dL (ref 8.4–10.5)
Chloride: 107 mEq/L (ref 96–112)
Creatinine, Ser: 0.75 mg/dL (ref 0.50–1.10)
GFR calc Af Amer: >90 mL/min (ref >90)
GFR calc non Af Amer: >90 mL/min (ref >90)
Glucose, Bld: 120 mg/dL — ABNORMAL HIGH (ref 70–99)
Potassium: 3.5 mEq/L (ref 3.5–5.1)
Sodium: 141 mEq/L (ref 135–145)
Total Bilirubin: 0.5 mg/dL (ref 0.3–1.2)
Total Protein: 8 g/dL (ref 6.0–8.3)

## 2011-07-06 LAB — URINALYSIS, ROUTINE W REFLEX MICROSCOPIC
Bilirubin Urine: NEGATIVE
Glucose, UA: NEGATIVE mg/dL
Ketones, ur: NEGATIVE mg/dL
Leukocytes, UA: NEGATIVE
Nitrite: POSITIVE — AB
Specific Gravity, Urine: >1.03 — ABNORMAL HIGH (ref 1.005–1.030)
Urobilinogen, UA: 0.2 mg/dL (ref 0.0–1.0)
pH: 5.5 (ref 5.0–8.0)

## 2011-07-06 LAB — ETHANOL: Alcohol, Ethyl (B): <11 mg/dL (ref 0–11)

## 2011-07-06 LAB — URINE MICROSCOPIC-ADD ON

## 2011-07-06 LAB — PREGNANCY, URINE: Preg Test, Ur: NEGATIVE

## 2011-07-06 LAB — SALICYLATE LEVEL: Salicylate Lvl: 0.2 mg/dL — ABNORMAL LOW (ref 2.8–20.0)

## 2011-07-06 LAB — ACETAMINOPHEN LEVEL: Acetaminophen (Tylenol), Serum: <15 ug/mL (ref 10–30)

## 2011-07-06 LAB — RAPID URINE DRUG SCREEN, HOSP PERFORMED
Amphetamines: NOT DETECTED
Barbiturates: NOT DETECTED
Benzodiazepines: NOT DETECTED
Cocaine: NOT DETECTED
Opiates: NOT DETECTED
Tetrahydrocannabinol: POSITIVE — AB

## 2011-07-06 MED ORDER — ALUM & MAG HYDROXIDE-SIMETH 200-200-20 MG/5ML PO SUSP: 30.0000 mL | ORAL | Status: DC | PRN

## 2011-07-06 MED ORDER — HYDROXYZINE PAMOATE 25 MG PO CAPS: 25.0000 mg | ORAL_CAPSULE | Freq: Every evening | ORAL | Status: DC | PRN

## 2011-07-06 MED ORDER — ONDANSETRON 4 MG PO TBDP
4.0000 mg | ORAL_TABLET | Freq: Three times a day (TID) | ORAL | Status: DC | PRN
  Administered 2011-07-06: 4 mg via ORAL
  Filled 2011-07-06: qty 1

## 2011-07-06 MED ORDER — MAGNESIUM HYDROXIDE 400 MG/5ML PO SUSP: 30.0000 mL | Freq: Every day | ORAL | Status: DC | PRN

## 2011-07-06 MED ORDER — ZOLPIDEM TARTRATE 5 MG PO TABS
5.0000 mg | ORAL_TABLET | Freq: Every evening | ORAL | Status: DC | PRN
  Administered 2011-07-07 – 2011-07-09 (×3): 5 mg via ORAL
  Filled 2011-07-06: qty 1
  Filled 2011-07-06: qty 2
  Filled 2011-07-06 (×2): qty 1

## 2011-07-06 NOTE — ED Notes (Signed)
Pt resting comfortably with eyes closed.  Respirations regular, even, unlabored  No distress noted at this time.  Poison control center called to follow up on status.  No additional recommendations.
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
## 2011-07-06 NOTE — ED Notes (Signed)
Pt requesting something for nausea.  edp notified and orders received.  
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
## 2011-07-06 NOTE — ED Notes (Addendum)
Spoke with Merdis Delay at [employer].  Related pt's condition. Recommendations from poision control include holding and observing pt for about 6 hours to monitor for tachycardia and EKG abnormalities.  Merdis Delay also recommended 1 g/kg of activated charcoal, if pt is not an aspiration risk.   Spoke with Dr. Dierdre Highman, and relayed recommendations.  Based on the fact pt remains somewhat drowsy and does have some n/v, she is at high risk for aspiration, so decision to hold charcoal was made.

## 2011-07-06 NOTE — Progress Notes (Signed)
[name] remains suicidal and she is unable to contract for safety. She has now been accepted to [facility] as a voluntary admission to he [facility] hall. Dr Catha Brow is the accepting. Presert and support paperwork completed copied and faxed. Patient will be transported by [facility]. Dr. Bebe Shaggy is in agreement with the disposition.

## 2011-07-06 NOTE — ED Notes (Signed)
Samson Frederic in department for evaluation.

## 2011-07-06 NOTE — Tx Team (Signed)
Initial Interdisciplinary Treatment Plan  PATIENT STRENGTHS: (choose at least two) Ability for insight Average or above average intelligence Physical Health  PATIENT STRESSORS: Financial difficulties   PROBLEM LIST: Problem List/Patient Goals Date to be addressed Date deferred Reason deferred Estimated date of resolution  Depression 07/06/11                                                      DISCHARGE CRITERIA:  Ability to meet basic life and health needs Improved stabilization in mood, thinking, and/or behavior Motivation to continue treatment in a less acute level of care Need for constant or close observation no longer present Reduction of life-threatening or endangering symptoms to within safe limits Safe-care adequate arrangements made Verbal commitment to aftercare and medication compliance  PRELIMINARY DISCHARGE PLAN: Return to previous living arrangement  PATIENT/FAMIILY INVOLVEMENT: This treatment plan has been presented to and reviewed with the patient, [name], and/or family member  The patient and family have been given the opportunity to ask questions and make suggestions.  [place], [name] 07/06/2011, 6:11 PM
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
## 2011-07-06 NOTE — ED Notes (Signed)
Pt resting comfortably with eyes closed.  Equal bil chest rise and fall.  Sitter at bedside.  nad noted.

## 2011-07-06 NOTE — ED Notes (Signed)
Pt evaluated by Samson Frederic with ACT team.  Pt placed in paper scrubs.

## 2011-07-06 NOTE — ED Notes (Signed)
Pt up to bedside commode with assistance.  Family remains at bedside.  Safety precautions in place.

## 2011-07-07 DIAGNOSIS — F329 Major depressive disorder, single episode, unspecified: Secondary | ICD-10-CM

## 2011-07-07 MED ORDER — LITHIUM CARBONATE 300 MG PO CAPS
300.0000 mg | ORAL_CAPSULE | Freq: Two times a day (BID) | ORAL | Status: DC
  Administered 2011-07-07 – 2011-07-10 (×7): 300 mg via ORAL
  Filled 2011-07-07 (×11): qty 1

## 2011-07-07 MED ORDER — SULFAMETHOXAZOLE-TMP DS 800-160 MG PO TABS
1.0000 | ORAL_TABLET | Freq: Two times a day (BID) | ORAL | Status: DC
  Administered 2011-07-07 – 2011-07-10 (×7): 1 via ORAL
  Filled 2011-07-07 (×9): qty 1

## 2011-07-07 MED ORDER — ETONOGESTREL 68 MG ~~LOC~~ IMPL: 1.0000 | DRUG_IMPLANT | Freq: Once | SUBCUTANEOUS | Status: DC
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
## 2011-07-07 NOTE — H&P (Signed)
Medical/psychiatric screening examination/treatment/procedure(s) were performed by non-physician practitioner and as supervising physician I was immediately available for consultation/collaboration.   

## 2011-07-07 NOTE — Progress Notes (Signed)
Pt is pleasant and cooperative  She interacts minimally with others  She is depressed but denies suicidal and homicidal ideation  Verbal support given   Q 15 min checks  Pt safe at present
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
## 2011-07-07 NOTE — Tx Team (Signed)
Interdisciplinary Treatment Plan Update (Adult)  Date:  07/07/2011  Time Reviewed:  11:46 AM   Progress in Treatment: Attending groups: Yes Participating in groups:  Yes Taking medication as prescribed: Yes Tolerating medication:  Yes Family/Significant othe contact made:  Yes - contact made with pt's mother Patient understands diagnosis:  Yes Discussing patient identified problems/goals with staff:  Yes Medical problems stabilized or resolved:  Yes Denies suicidal/homicidal ideation: Yes Issues/concerns per patient self-inventory:  None identified Other: N/A  New problem(s) identified: None Identified  Reason for Continuation of Hospitalization: Anxiety Depression Medication stabilization Suicidal ideation  Interventions implemented related to continuation of hospitalization: mood stabilization, medication monitoring and adjustment, group therapy and psycho education, safety checks q 15 mins  Additional comments: N/A  Estimated length of stay: 3-5 days  Discharge Plan: Pt will follow up with [facility] for medication management and therapy   New goal(s): N/A  Review of initial/current patient goals per problem list:    1.  Goal(s): Reduce depressive symptoms  Met:  No  Target date: by discharge  As evidenced by: Reducing depression from a 10 to a 3 as reported by pt. Pt ranks at a 7 today.   2.  Goal (s): Reduce/Eliminate suicidal ideation  Met:  No  Target date: by discharge  As evidenced by: pt reporting no SI.    3.  Goal(s): Reduce anxiety symptoms  Met:  No  Target date: by discharge  As evidenced by: Reduce anxiety from a 10 to a 3 as reported by pt. Pt ranks at a 10 today.    Attendees: Patient:  [name] 07/07/2011 11:52 AM   Family:     Physician:  Orson Aloe, MD  07/07/2011  11:46 AM   Nursing:   Omelia Blackwater, RN 07/07/2011 11:47 AM   Case Manager:  Reyes Ivan, LCSWA 07/07/2011  11:46 AM   Counselor:  Angus Palms, LCSW  07/07/2011  11:46 AM   Other:  Juline Patch, LCSW 07/07/2011  11:46 AM   Other:   07/07/2011  11:46 AM   Other:     Other:      Scribe for Treatment Team:   Carmina Miller, 07/07/2011 , 11:46 AM

## 2011-07-07 NOTE — Progress Notes (Signed)
Pt attended discharge planning group and actively participated.  Pt presents with tearful and flat affect, and depressed mood.  Pt was open with sharing reason for entering the hospital.  Pt states that she has been depressed since 21 years old, but described it as "a blurry and pressured life" instead.  Pt states that she is stressed out with being a single mother to her 68, 60 and 40 year old children, having no income or support.  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  Pt states that she overdosed on pills because she was tired of being in pain, both physically and emotionally and wanted to sleep.  Pt states that her mom is caring for her children at this time.  Pt states that pt lives in [place] with her children.  Pt has transportation home.  Pt states that she has never been to a psychiatrist or therapist but would be open to a referral.  SW will refer pt to [facility] for medication management and therapy.  Safety planning and suicide prevention discussed.     Chelsea Horton, LCSWA 07/07/2011  10:00 AM

## 2011-07-07 NOTE — Progress Notes (Signed)
Pt rates depression at a 6 and hopelessness at a 6. Pt attends groups and interacts well with peers and staff. Pt continues to ask if she can leave. Pt was offered support and encouragement. Pt denies SI/HI. Pt is receptive to treatment and safety is maintained on unit.

## 2011-07-07 NOTE — Progress Notes (Signed)
Recreation Therapy Notes  07/07/2011         Time: 1415      Group Topic/Focus: The focus of the group is on enhancing the patients' ability to cope with stressors by understanding what coping is, why it is important, the negative effects of stress and developing healthier coping skills.  Participation Level: Active  Participation Quality: Attentive  Affect: Appropriate  Cognitive: Oriented   Additional Comments: None.   MATTHEWS, AMY 07/07/2011 3:01 PM 

## 2011-07-07 NOTE — Progress Notes (Signed)
[facility] Group Notes:  (Counselor/Nursing/MHT/Case Management/Adjunct)  07/07/2011 2:45 PM  Type of Therapy:  1:15PM Mental Health Association Group  Participation Level:  Active  Participation Quality:  Appropriate and Attentive  Affect:  Appropriate  Cognitive:  Alert and Appropriate  Insight:  Good  Engagement in Group:  Good  Engagement in Therapy:  Good  Modes of Intervention:  Education  Summary of Progress/Problems: Patient appeared engaged in the discussion about the Mental Health Association.   Wilmon Arms 07/07/2011, 2:45 PM  Cosigned by: Angus Palms, LCSW
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
## 2011-07-07 NOTE — Progress Notes (Signed)
[facility] Group Notes:  (Counselor/Nursing/MHT/Case Management/Adjunct)  07/07/2011 12:33 PM  Type of Therapy:  Group Therapy  Participation Level:  Active  Participation Quality:  Appropriate  Affect:  Appropriate  Cognitive:  Appropriate  Insight:  Good  Engagement in Group:  Good  Engagement in Therapy:  Good  Modes of Intervention:  Support  Summary of Progress/Problems: Mikyla participated actively in group, reporting her feelings of frustration and being overwhelmed in taking care of her three children and meeting the demands of assorted family members. She indicated that there was nothing she could do to change her current situation, but appeared open to the idea of radical acceptance.   Luanna Cole 07/07/2011, 12:33 PM

## 2011-07-07 NOTE — Progress Notes (Signed)
Patient ID: [name], female   DOB: 11-12-1990, 21 y.o.   MRN: [id number] Patient took scheduled 2000 medications this evening without incident. States that she feels sad this evening and when asked about if sad about anything in particular, patient stated just general. States that she wants to talk to her three children this evening, which will make her feel better. Her children are ages 94, 18, and 1. No signs of distress this evening.

## 2011-07-08 LAB — URINE CULTURE
Colony Count: >=100000
Culture  Setup Time: 201302071845

## 2011-07-08 MED ORDER — IBUPROFEN 600 MG PO TABS
600.0000 mg | ORAL_TABLET | Freq: Four times a day (QID) | ORAL | Status: DC | PRN
Start: 2011-07-08 — End: 2011-07-10
  Administered 2011-07-08 – 2011-07-09 (×3): 600 mg via ORAL
  Filled 2011-07-08 (×3): qty 1

## 2011-07-08 NOTE — Progress Notes (Signed)
Pt attended and participated in aftercare planning group. Pt was given suicide prevention information as well as crisis hotlines and numbers to utilize. Pt stated that she will be following up with [facility] after D/C. Pt states that she is here for an O/D. Pt did not have any questions or concerns about D/C.

## 2011-07-08 NOTE — Progress Notes (Signed)
Patient seen by writer at medication window to receive her scheduled 2000 medications. Patient reports feeling depressed and has had a okay day as she described it.  Patient currently denies si/hi/a/v hall. Writer informed patient that she has no other scheduled medications at 2200 and patient requested a prn of ambien and ibuprofen for her back ache. Writer asked if she wanted ibuprofen now and she requested to take it later at 2230. Safety maintained on unit will continue to monitor.

## 2011-07-08 NOTE — Progress Notes (Signed)
Patient ID: [name], female   DOB: July 05, 1990, 21 y.o.   MRN: [id number]  Pt was seen today. Reports poor memory. Anxiety under control. No drug abuse. No acute new problems. Tolerating meds.  Mental Status Examination/Evaluation:  Objective: Appearance: Casual-hair sticking out   Psychomotor Activity: Normal   Eye Contact:: Good   Speech: Clear and Coherent   Volume: Normal   Mood: Euthymic   Affect: Appropriate   Thought Process:   Orientation: Full   Thought Content: Clear rational goal oriented- get back to children   Suicidal Thoughts: No   Homicidal Thoughts: No   Judgement: Fair   Insight: Fair   DIAGNOSIS:  AXIS I  Adjustment Disorder NOS   AXIS II  Deferred   AXIS III  See medical history.   AXIS IV  economic problems, educational problems, housing problems, occupational problems, other psychosocial or environmental problems and problems with primary support group         Treatment Plan Summary:    1. Continue current meds

## 2011-07-08 NOTE — Progress Notes (Signed)
[facility] Group Notes:  (Counselor/Nursing/MHT/Case Management/Adjunct)  07/08/2011 1315  Type of Therapy:  Group Therapy  Participation Level:  Active  Participation Quality:  Appropriate, Attentive, Sharing and Supportive  Affect:  Appropriate  Cognitive:  Appropriate  Insight:  Good  Engagement in Group:  Good  Engagement in Therapy:  Good  Modes of Intervention:  Clarification, Problem-solving, Socialization and Support  Summary of Progress/Problems: Pt attended and participated in group counseling session on self-sabotage and enabling behaviors/beliefs. Pt stated that she tends to bash her self-esteem, and disagreed with the self-sabotage behavior of giving to others to feel her life purpose. Pt states that the more she gives to others the less she receives in return. Pt does not believe giving is beneficial to her relationships. Pt stated that she feels she has no value and feels that she is useless to people. Pt stated that in order for her to break free from this sabotaging belief she will re-frame her irrational belief and realize that she is valued by her children. Pt processed well in group and was able to identify her sabotage and enabling behaviors.    Crosswell, Desiree 07/08/2011, 2:51 PM

## 2011-07-09 NOTE — Progress Notes (Signed)
Patient ID: CORLISS COGGESHALL, female   DOB: April 26, 1991, 21 y.o.   MRN: [id number] Patient ID: JAYLEE FREEZE, female   DOB: [birth year], 21 y.o.   MRN: [id number]  Pt was seen today. Reports getting better now. Anxiety under control. No drug abuse. No new acute problems. Tolerating meds and thinks meds are helping. Able to sleep well.  Mental Status Examination/Evaluation:  Objective: Appearance: Casual-hair sticking out   Psychomotor Activity: Normal   Eye Contact:: Good   Speech: Clear and Coherent   Volume: Normal   Mood: Euthymic   Affect: Appropriate   Thought Process:   Orientation: Full   Thought Content: Clear rational goal oriented- get back to children   Suicidal Thoughts: No   Homicidal Thoughts: No   Judgement: Fair   Insight: Fair   DIAGNOSIS:  AXIS I  Adjustment Disorder NOS   AXIS II  Deferred   AXIS III  See medical history.   AXIS IV  economic problems, educational problems, housing problems, occupational problems, other psychosocial or environmental problems and problems with primary support group         Treatment Plan Summary:    1. Continue current meds

## 2011-07-09 NOTE — Progress Notes (Signed)
Patient ID: [name], female   DOB: 11-06-1990, 21 y.o.   MRN: [id number]  07/09/11 Nursing D [name] cont to try to understand her problems . She is seen out in the milieu. Interacting with her peers. She takes her meds as ordered and she attneds her groups as scheduled. She completed her self inventory and returned it and on it it said, she denied SI, that she rated her depression and hopelessness " 1 / 1 " and that her DC plans include taking her meds and following up with [facility] as scheduled.  A POC is maintained and therapeutic relationship is fostered. R Safety is amintained PD RN [facility]

## 2011-07-09 NOTE — Progress Notes (Signed)
[facility] Group Notes:  (Counselor/Nursing/MHT/Case Management/Adjunct)  07/09/2011 1315  Type of Therapy:  Group Therapy  Participation Level:  Active  Participation Quality:  Appropriate and Resistant  Affect:  Blunted  Cognitive:  Appropriate  Insight:  Good  Engagement in Group:  Good  Engagement in Therapy:  Good  Modes of Intervention:  Activity, Clarification, Problem-solving and Support  Summary of Progress/Problems: Pt attended and participated in group session on support. Pt was asked to identify a support in her life. Pt stated that her cousin supports her because she understands what she is going through. Pt did not have positive insight on what to do in a crisis situation. When asked what she would do if her supports were not present, pt stated "I will just be back in here". Pt was encouraged by writer to explore alternative support, including support groups and therapy. Pt was able to process during group but seemed to be resistant to change.   Crosswell, Desiree 07/09/2011, 2:43 PM

## 2011-07-09 NOTE — Progress Notes (Signed)
Pt attended and participated in aftercare planning group. Pt received suicide prevention information and crisis numbers to use. Pt stated that she is feeling better than yesterday. Pt denies any SI or HI. Pt did not have any questions about aftercare plans or D/C.

## 2011-07-10 DIAGNOSIS — F339 Major depressive disorder, recurrent, unspecified: Principal | ICD-10-CM | POA: Diagnosis present

## 2011-07-10 MED ORDER — LITHIUM CARBONATE 300 MG PO CAPS: 300.0000 mg | ORAL_CAPSULE | Freq: Two times a day (BID) | ORAL | Status: DC

## 2011-07-10 MED ORDER — ZOLPIDEM TARTRATE 5 MG PO TABS: 5.0000 mg | ORAL_TABLET | Freq: Every evening | ORAL | Status: DC | PRN

## 2011-07-10 MED ORDER — SULFAMETHOXAZOLE-TMP DS 800-160 MG PO TABS: 1.0000 | ORAL_TABLET | Freq: Two times a day (BID) | ORAL | Status: AC

## 2011-07-10 MED ORDER — IBUPROFEN 600 MG PO TABS: 600.0000 mg | ORAL_TABLET | Freq: Four times a day (QID) | ORAL | Status: AC | PRN

## 2011-07-10 NOTE — [facility] Suicide Risk Assessment (Signed)
Suicide Risk Assessment  Discharge Assessment     Demographic factors:  Assessment Details Time of Assessment: Admission Information Obtained From: Patient Current Mental Status:  Current Mental Status:  (Denies) Risk Reduction Factors:  Risk Reduction Factors: Responsible for children under 22 years of age;Sense of responsibility to family;Religious beliefs about death  CLINICAL FACTORS:   Severe Anxiety and/or Agitation Bipolar Disorder:   Bipolar II Previous Psychiatric Diagnoses and Treatments Medical Diagnoses and Treatments/Surgeries  COGNITIVE FEATURES THAT CONTRIBUTE TO RISK:  Thought constriction (tunnel vision)    SUICIDE RISK:   Minimal: No identifiable suicidal ideation.  Patients presenting with no risk factors but with morbid ruminations; may be classified as minimal risk based on the severity of the depressive symptoms  ADL's:  Intact  Sleep: Good  Appetite:  Good  Suicidal Ideation:  Denies adamantly any suicidal thoughts. Homicidal Ideation:  Denies adamantly any homicidal thoughts.  Mental Status Examination/Evaluation: Objective:  Appearance: Casual  Eye Contact::  Good  Speech:  Clear and Coherent  Volume:  Normal  Mood:  Euthymic  Affect:  Congruent  Thought Process:  Coherent  Orientation:  Full  Thought Content:  WDL  Suicidal Thoughts:  No  Homicidal Thoughts:  No  Memory:  Immediate;   Good  Judgement:  Good  Insight:  Good  Psychomotor Activity:  Normal  Concentration:  Good  Recall:  Good  Akathisia:  No  AIMS (if indicated):     Assets:  Communication Skills Desire for Improvement Financial Resources/Insurance Housing Intimacy Leisure Time Physical Health Resilience Social Support Talents/Skills  Sleep:   Number of Hours: 6.5    Vital Signs: Blood pressure 111/70, pulse 87, temperature 97.9 F (36.6 C), temperature source Oral, resp. rate 16, height 5\' 4"  (1.626 m), weight 78.019 kg (172 lb). Current Medications:     .  etonogestrel  1 each Subcutaneous Once  . lithium carbonate  300 mg Oral BID  . sulfamethoxazole-trimethoprim  1 tablet Oral Q12H   Labs No results found for this or any previous visit (from the past 48 hour(s)).  What pt has learned from hospital stay is "I will never try that again EVER."  Risk of self harm is elevated by her first and only suicide attempt.  She now has herself to live for as well as her kids.  Risk of harm to others is minimal in that she has not been involved in fights or had any legal charges filed on her.  PLAN: Discharge home Continue Medication List  As of 07/10/2011  1:08 PM   TAKE these medications         ibuprofen 600 MG tablet   Commonly known as: ADVIL,MOTRIN   Take 1 tablet (600 mg total) by mouth every 6 (six) hours as needed for pain.      IMPLANON 68 MG Impl implant   Generic drug: etonogestrel   Inject 1 each into the skin once.      lithium carbonate 300 MG capsule   Take 1 capsule (300 mg total) by mouth 2 (two) times daily. For mood control.      sulfamethoxazole-trimethoprim 800-160 MG per tablet   Commonly known as: BACTRIM DS   Take 1 tablet by mouth every 12 (twelve) hours. For urinary tract infection.  Take until GONE all gone.      zolpidem 5 MG tablet   Commonly known as: AMBIEN   Take 1 tablet (5 mg total) by mouth at bedtime as needed for sleep.  WALKER, EDWIN 07/10/2011, 1:08 PM

## 2011-07-10 NOTE — Progress Notes (Signed)
Patient ID: [name], female   DOB: Oct 01, 1990, 21 y.o.   MRN: [id number] Pt denied SI/HI/AVH on discharge.  Discharge instructions given and explained with crisis numbers, Rx, and medication samples.  Pt belongings were returned (head scarf) and escorted to her ride in the lobby.  Krislyn was smiling and said she was "so excited to see her kids!"  (ages 4,3,1--little girls)

## 2011-07-10 NOTE — Progress Notes (Signed)
[facility] Group Notes:  (Counselor/Nursing/MHT/Case Management/Adjunct)  07/10/2011 12:13 PM  Type of Therapy:  Group Therapy  Participation Level:  Active  Participation Quality:  Attentive  Affect:  Appropriate  Cognitive:  Appropriate  Insight:  Good  Engagement in Group:  Good  Engagement in Therapy:  Good  Modes of Intervention:  Support  Summary of Progress/Problems:Sonnie appeared to be in a positive mood, and participated actively in group, frequently making jokes and offering support to other group members.   Mendleson, Jenna 07/10/2011, 12:13 PM

## 2011-07-10 NOTE — Progress Notes (Signed)
Patient ID: [name], female   DOB: [birth year], 20 y.o.   MRN: [id number] Pt denies SI/HI/AVH.  She reported her sleep as fair, appetite as good, ability to pay attention as good, and no depression or hopelessness on his self-inventory report.  Her goals when she gets home is to take medications, follow-up with [facility], and work on thinking positive.
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
## 2011-07-10 NOTE — Progress Notes (Signed)
Writer observed patient sitting in the dayroom interacting with select peers. Writer informed patient that she had 2000 medications that needed to be given and came to medication window. Patient reported that she is hopeful to go home on tomorrow because she misses her children. Patient reports that she feel much better and is going to continue to take her medications once discharged. She currently denies having pain, -si/hi/a/v hall. Safety maintained on unit, will continue to monitor.

## 2011-07-10 NOTE — Progress Notes (Signed)
[facility] Case Management Discharge Plan:  Will you be returning to the same living situation after discharge: Yes,  pt to return home At discharge, do you have transportation home?:Yes,  pt's mother to provide transportation Do you have the ability to pay for your medications:Yes,  provided samples and access to meds  Release of information consent forms completed and in the chart;  Patient's signature needed at discharge.  Patient to Follow up at:  Follow-up Information    Follow up with Arna Medici on 07/12/2011. (Appointment scheduled at 8:00 am Auth #[id number])    Contact information:   [address]  [place], [place] [address] [phone]         Patient denies SI/HI:   Yes,  denies SI/HI    Safety Planning and Suicide Prevention discussed:  Yes,  discussed with pt  Barrier to discharge identified:No.  Summary and Recommendations: Pt attended discharge planning group and actively participated.  Pt presents with bright mood and affect.  Pt reports feeling stable to d/c today.  Pt ranks depression and anxiety at a 0 today.  Pt denies SI/HI.  Pt discussed her decision to overdose was a bad decision and she would never do this again.  Pt is eager to get home to her children.  No recommendations from SW.  No further needs voiced by pt.  Pt stable to discharge.      Carmina Miller 07/10/2011, 11:53 AM

## 2011-07-10 NOTE — Tx Team (Signed)
Interdisciplinary Treatment Plan Update (Adult)  Date:  07/10/2011  Time Reviewed:  10:44 AM   Progress in Treatment: Attending groups: Yes Participating in groups:  Yes Taking medication as prescribed: Yes Tolerating medication:  Yes Family/Significant othe contact made: Yes - contact made with mother   Patient understands diagnosis:  Yes Discussing patient identified problems/goals with staff:  Yes Medical problems stabilized or resolved:  Yes Denies suicidal/homicidal ideation: Yes Issues/concerns per patient self-inventory:  None identified Other: N/A  New problem(s) identified: None Identified  Reason for Continuation of Hospitalization: Stable to d/c  Interventions implemented related to continuation of hospitalization: Stable to d/c  Additional comments: N/A  Estimated length of stay: D/C today  Discharge Plan: Pt will follow up at [facility] for medication management and therapy  New goal(s): N/A  Review of initial/current patient goals per problem list:    1.  Goal(s): Reduce depressive symptoms  Met:  Yes  Target date: by discharge  As evidenced by: Reducing depression from a 10 to a 3 as reported by pt.  Pt ranks at a 0 today.   2.  Goal (s): Reduce/Eliminate suicidal ideation  Met:  Yes  Target date: by discharge  As evidenced by: pt denies SI today    3.  Goal(s): Reduce anxiety symptoms  Met:  Yes  Target date: by discharge  As evidenced by: Reduce anxiety from a 10 to a 3 as reported by pt.  Pt ranks at a 0 today.    Attendees: Patient:  [name] 07/10/2011 10:46 AM   Family:     Physician:  Orson Aloe, MD  07/10/2011  10:44 AM   Nursing:   Nanine Means, RN 07/10/2011 10:46 AM   Case Manager:  Reyes Ivan, LCSWA 07/10/2011  10:44 AM   Counselor:  Angus Palms, LCSW 07/10/2011  10:44 AM   Other:  Juline Patch, LCSW 07/10/2011  10:44 AM   Other:  Serena Colonel, NP 07/10/2011  10:44 AM   Other:  Quintella Reichert, RN  07/10/2011 10:46 AM   Other:  Magdalen Spatz, counseling intern 07/10/2011 10:46 AM    Scribe for Treatment Team:   Carmina Miller, 07/10/2011 , 10:44 AM

## 2011-07-10 NOTE — Progress Notes (Signed)
[facility] MD Progress Note  07/10/2011 12:53 PM  "I am good today, I mean feeling good. My sleep was so, so. I attended group. I think I benefited from listening to everybody and learning coping skills. I am not suicidal. I feel guilty now knowing I almost ended my life. When you are in that state of mind, you really don't think about anything else but to end it. I am glad that I was able to take my life"  Diagnosis:  Axis I: Major depressive disorder, recurrent. Axis II: Deferred Axis III: History reviewed. No pertinent past medical history. Axis IV: No change Axis V: 70  ADL's:  Intact  Sleep: Good  Appetite:  Good  Suicidal Ideation:  Plan:  No Intent:  No Means:  No Homicidal Ideation:  Plan:  No Intent:  NO Means:  No  AEB (as evidenced by):  Mental Status Examination/Evaluation: Objective:  Appearance: Casual  Eye Contact::  Good  Speech:  Clear and Coherent  Volume:  Normal  Mood:  Euthymic  Affect:  Appropriate  Thought Process:  Coherent  Orientation:  Full  Thought Content:  Rumination  Suicidal Thoughts:  No  Homicidal Thoughts:  No  Memory:  Immediate;   Good Recent;   Good Remote;   Good  Judgement:  Fair  Insight:  Good  Psychomotor Activity:  Normal  Concentration:  Good  Recall:  Good  Akathisia:  No  Handed:  Right  AIMS (if indicated):     Assets:  Desire for Improvement  Sleep:  Number of Hours: 6.5    Vital Signs:Blood pressure 111/70, pulse 87, temperature 97.9 F (36.6 C), temperature source Oral, resp. rate 16, height 5\' 4"  (1.626 m), weight 78.019 kg (172 lb). Current Medications: Current Facility-Administered Medications  Medication Dose Route Frequency Provider Last Rate Last Dose  . alum & mag hydroxide-simeth (MAALOX/MYLANTA) 200-200-20 MG/5ML suspension 30 mL  30 mL Oral Q4H PRN Viviann Spare, NP      . etonogestrel (IMPLANON) implant 68 mg  1 each Subcutaneous Once Orson Aloe, MD      . hydrOXYzine (VISTARIL) capsule 25 mg   25 mg Oral QHS PRN Viviann Spare, NP      . ibuprofen (ADVIL,MOTRIN) tablet 600 mg  600 mg Oral Q6H PRN Mickeal Skinner, MD   600 mg at 07/09/11 2232  . lithium carbonate capsule 300 mg  300 mg Oral BID Orson Aloe, MD   300 mg at 07/10/11 0804  . magnesium hydroxide (MILK OF MAGNESIA) suspension 30 mL  30 mL Oral Daily PRN Viviann Spare, NP      . sulfamethoxazole-trimethoprim (BACTRIM DS) 800-160 MG per tablet 1 tablet  1 tablet Oral Q12H Orson Aloe, MD   1 tablet at 07/10/11 0804  . zolpidem (AMBIEN) tablet 5 mg  5 mg Oral QHS PRN Mickie D. Adams, PA   5 mg at 07/09/11 2233    Lab Results: No results found for this or any previous visit (from the past 48 hour(s)).  Physical Findings: AIMS:  , ,  ,  ,    CIWA:    COWS:     Treatment Plan Summary: Daily contact with patient to assess and evaluate symptoms and progress in treatment Medication management  Plan: Continue current treatment plan.  Armandina Stammer I 07/10/2011, 12:53 PM

## 2011-07-12 NOTE — Progress Notes (Signed)
Patient Discharge Instructions:  Admission Note Faxed,  07/11/2011 After Visit Summary Faxed,  07/11/2011 Faxed to the [facility] Care provider:  07/11/2011 D/C Summar Note faxed 07/11/2011 Facesheet faxed 07/11/2011  Faxed to [facility] @ [phone]  Daisuke Bailey, Eduard Clos, 07/12/2011, 1:06 PM

## 2012-02-11 ENCOUNTER — Encounter (HOSPITAL_COMMUNITY): Payer: Self-pay | Admitting: Emergency Medicine

## 2012-02-11 ENCOUNTER — Emergency Department (HOSPITAL_COMMUNITY)
Admission: EM | Admit: 2012-02-11 | Discharge: 2012-02-11 | Disposition: A | Discharge status: Home / Self Care | Payer: No Typology Code available for payment source | Attending: Emergency Medicine | Admitting: Emergency Medicine

## 2012-02-11 ENCOUNTER — Emergency Department (HOSPITAL_COMMUNITY): Payer: No Typology Code available for payment source

## 2012-02-11 DIAGNOSIS — F17200 Nicotine dependence, unspecified, uncomplicated: Secondary | ICD-10-CM | POA: Insufficient documentation

## 2012-02-11 DIAGNOSIS — X58XXXA Exposure to other specified factors, initial encounter: Secondary | ICD-10-CM | POA: Insufficient documentation

## 2012-02-11 DIAGNOSIS — F319 Bipolar disorder, unspecified: Secondary | ICD-10-CM | POA: Insufficient documentation

## 2012-02-11 DIAGNOSIS — S335XXA Sprain of ligaments of lumbar spine, initial encounter: Secondary | ICD-10-CM | POA: Insufficient documentation

## 2012-02-11 DIAGNOSIS — S39012A Strain of muscle, fascia and tendon of lower back, initial encounter: Secondary | ICD-10-CM

## 2012-02-11 DIAGNOSIS — G8929 Other chronic pain: Secondary | ICD-10-CM | POA: Insufficient documentation

## 2012-02-11 HISTORY — DX: Other chronic pain: G89.29

## 2012-02-11 HISTORY — DX: Bipolar disorder, unspecified: F31.9

## 2012-02-11 HISTORY — DX: Depression, unspecified: F32.A

## 2012-02-11 HISTORY — DX: Other chronic pain: M54.9

## 2012-02-11 HISTORY — DX: Major depressive disorder, single episode, unspecified: F32.9

## 2012-02-11 LAB — POCT PREGNANCY, URINE: Preg Test, Ur: NEGATIVE

## 2012-02-11 MED ORDER — TRAMADOL HCL 50 MG PO TABS
50.0000 mg | ORAL_TABLET | Freq: Once | ORAL | Status: AC
  Administered 2012-02-11: 50 mg via ORAL
  Filled 2012-02-11: qty 1

## 2012-02-11 MED ORDER — CYCLOBENZAPRINE HCL 5 MG PO TABS: 5.0000 mg | ORAL_TABLET | Freq: Three times a day (TID) | ORAL | Status: AC | PRN

## 2012-02-11 MED ORDER — CYCLOBENZAPRINE HCL 10 MG PO TABS
10.0000 mg | ORAL_TABLET | Freq: Once | ORAL | Status: AC
  Administered 2012-02-11: 10 mg via ORAL
  Filled 2012-02-11: qty 1

## 2012-02-11 MED ORDER — TRAMADOL HCL 50 MG PO TABS: 50.0000 mg | ORAL_TABLET | Freq: Four times a day (QID) | ORAL | Status: AC | PRN
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
## 2012-02-11 NOTE — ED Notes (Signed)
Patient transported to X-ray 

## 2012-02-11 NOTE — ED Provider Notes (Signed)
History     CSN: [id number]  Arrival date & time 02/11/12  1532   First MD Initiated Contact with Patient 02/11/12 1705      Chief Complaint  Patient presents with  . Back Pain    (Consider location/radiation/quality/duration/timing/severity/associated sxs/prior treatment) HPI Comments: [name]  presents with acute on chronic low back pain which has which has been present for the past  Day.  She was holding an infant while standing at a cookout yesterday,  When she felt a sudden "pop" in her lower back which now radiates up her spine.   Patient denies any new injury specifically.  There is no radiation into the lower extremity.  There has been no weakness or numbness in the lower extremities and no urinary or bowel retention or incontinence.  Patient does not have a history of cancer or IVDU.  She has tried no pain relievers prior to arrival here   The history is provided by the patient.    Past Medical History  Diagnosis Date  . Bipolar 1 disorder   . Chronic back pain   . Depression     Past Surgical History  Procedure Date  . Cesarean section   . Tonsillectomy   . Addenoidectomy     History reviewed. No pertinent family history.  History  Substance Use Topics  . Smoking status: Current Every Day Smoker -- 0.2 packs/day for 3 years  . Smokeless tobacco: Not on file  . Alcohol Use: No    OB History    Grav Para Term Preterm Abortions TAB SAB Ect Mult Living                  Review of Systems  Constitutional: Negative for fever.  Respiratory: Negative for shortness of breath.   Cardiovascular: Negative for chest pain and leg swelling.  Gastrointestinal: Negative for abdominal pain, constipation and abdominal distention.  Genitourinary: Negative for dysuria, urgency, frequency, flank pain and difficulty urinating.  Musculoskeletal: Positive for back pain. Negative for joint swelling and gait problem.  Skin: Negative for rash.  Neurological:  Negative for weakness and numbness.    Allergies  Review of patient's allergies indicates no known allergies.  Home Medications   Current Outpatient Rx  Name Route Sig Dispense Refill  . CYCLOBENZAPRINE HCL 5 MG PO TABS Oral Take 1 tablet (5 mg total) by mouth 3 (three) times daily as needed for muscle spasms. 15 tablet 0  . ETONOGESTREL 68 MG [place] IMPL Subcutaneous Inject 1 each into the skin once.    Marland Kitchen LITHIUM CARBONATE 300 MG PO CAPS Oral Take 1 capsule (300 mg total) by mouth 2 (two) times daily. For mood control. 60 capsule 0  . TRAMADOL HCL 50 MG PO TABS Oral Take 1 tablet (50 mg total) by mouth every 6 (six) hours as needed for pain. 15 tablet 0  . ZOLPIDEM TARTRATE 5 MG PO TABS Oral Take 1 tablet (5 mg total) by mouth at bedtime as needed for sleep. 30 tablet 0    BP 128/68  Pulse 73  Temp 98 F (36.7 C) (Oral)  Resp 19  SpO2 100%  Physical Exam  Nursing note and vitals reviewed. Constitutional: She appears well-developed and well-nourished.  HENT:  Head: Normocephalic.  Eyes: Conjunctivae normal are normal.  Neck: Normal range of motion. Neck supple.  Cardiovascular: Normal rate and intact distal pulses.        Pedal pulses normal.  Pulmonary/Chest: Effort normal.  Abdominal: Soft. Bowel  sounds are normal. She exhibits no distension and no mass.  Musculoskeletal: Normal range of motion. She exhibits no edema.       Lumbar back: She exhibits bony tenderness and spasm. She exhibits no swelling and no edema.       Back:       Upper lumbar ttp midline with radiation of pain to mid parathoracic spine (bilateral).  Neurological: She is alert. She has normal strength. She displays no atrophy and no tremor. No sensory deficit. Gait normal.  Reflex Scores:      Bicep reflexes are 2+ on the right side and 2+ on the left side.      Patellar reflexes are 2+ on the right side and 2+ on the left side.      No strength deficit noted in hip and knee flexor and extensor muscle  groups.  Ankle flexion and extension intact.  Equal grip strength.  Skin: Skin is warm and dry. No rash noted.  Psychiatric: She has a normal mood and affect.    ED Course  Procedures (including critical care time)   Labs Reviewed  POCT PREGNANCY, URINE   Dg Lumbar Spine Complete  02/11/2012  *RADIOLOGY REPORT*  Clinical Data: Chronic low back pain.  LUMBAR SPINE - COMPLETE 4+ VIEW  Comparison: 05/16/2005  Findings: There are six lumbar-type non-rib bearing vertebra, the lowest which has slightly transitional characteristics.  No fracture, malalignment, or significant bony abnormality.  Prominence of stool throughout the colon suggests constipation.  IMPRESSION:  1. Prominence of stool throughout the colon suggests constipation. 2. Transitional lumbosacral vertebra noted.   Original Report Authenticated By: Dellia Cloud, M.D.      1. Lumbar strain       MDM  Pt with acute on chronic back pain.  No neuro deficit on exam or by history to suggest emergent or surgical presentation.  Also discussed worsened sx that should prompt immediate re-evaluation including distal weakness, bowel/bladder retention/incontinence.  Pt prescribed flexeril and ultram.  Encouraged ice followed by heat therapy.  Recheck by pcp prn.              Burgess Amor, [place] 02/12/12 [phone]

## 2012-02-19 NOTE — ED Provider Notes (Signed)
Medical screening examination/treatment/procedure(s) were performed by non-physician practitioner and as supervising physician I was immediately available for consultation/collaboration. Amy Albe, MD, [facility]   Ward Givens, MD 02/19/12 [phone]

## 2012-04-05 ENCOUNTER — Emergency Department (HOSPITAL_COMMUNITY): Payer: Medicaid Other

## 2012-04-05 ENCOUNTER — Emergency Department (HOSPITAL_COMMUNITY)
Admission: EM | Admit: 2012-04-05 | Discharge: 2012-04-05 | Disposition: A | Discharge status: Home / Self Care | Payer: Medicaid Other | Attending: Emergency Medicine | Admitting: Emergency Medicine

## 2012-04-05 ENCOUNTER — Encounter (HOSPITAL_COMMUNITY): Payer: Self-pay | Admitting: *Deleted

## 2012-04-05 DIAGNOSIS — F3289 Other specified depressive episodes: Secondary | ICD-10-CM | POA: Insufficient documentation

## 2012-04-05 DIAGNOSIS — K047 Periapical abscess without sinus: Secondary | ICD-10-CM | POA: Insufficient documentation

## 2012-04-05 DIAGNOSIS — F17200 Nicotine dependence, unspecified, uncomplicated: Secondary | ICD-10-CM | POA: Insufficient documentation

## 2012-04-05 DIAGNOSIS — M549 Dorsalgia, unspecified: Secondary | ICD-10-CM | POA: Insufficient documentation

## 2012-04-05 DIAGNOSIS — F329 Major depressive disorder, single episode, unspecified: Secondary | ICD-10-CM | POA: Insufficient documentation

## 2012-04-05 DIAGNOSIS — R220 Localized swelling, mass and lump, head: Secondary | ICD-10-CM

## 2012-04-05 DIAGNOSIS — F319 Bipolar disorder, unspecified: Secondary | ICD-10-CM | POA: Insufficient documentation

## 2012-04-05 LAB — CBC WITH DIFFERENTIAL/PLATELET
Basophils Absolute: 0 10*3/uL (ref 0.0–0.1)
Basophils Relative: 0 % (ref 0–1)
Eosinophils Absolute: 0 10*3/uL (ref 0.0–0.7)
Eosinophils Relative: 0 % (ref 0–5)
HCT: 42.2 % (ref 36.0–46.0)
Hemoglobin: 14.2 g/dL (ref 12.0–15.0)
Lymphocytes Relative: 13 % (ref 12–46)
Lymphs Abs: 1.7 10*3/uL (ref 0.7–4.0)
MCH: 28.1 pg (ref 26.0–34.0)
MCHC: 33.6 g/dL (ref 30.0–36.0)
MCV: 83.6 fL (ref 78.0–100.0)
Monocytes Absolute: 1 10*3/uL (ref 0.1–1.0)
Monocytes Relative: 8 % (ref 3–12)
Neutro Abs: 10.2 10*3/uL — ABNORMAL HIGH (ref 1.7–7.7)
Neutrophils Relative %: 79 % — ABNORMAL HIGH (ref 43–77)
Platelets: 301 10*3/uL (ref 150–400)
RBC: 5.05 MIL/uL (ref 3.87–5.11)
RDW: 12.4 % (ref 11.5–15.5)
WBC: 12.9 10*3/uL — ABNORMAL HIGH (ref 4.0–10.5)

## 2012-04-05 LAB — BASIC METABOLIC PANEL
BUN: 5 mg/dL — ABNORMAL LOW (ref 6–23)
CO2: 27 mEq/L (ref 19–32)
Calcium: 9.9 mg/dL (ref 8.4–10.5)
Chloride: 101 mEq/L (ref 96–112)
Creatinine, Ser: 0.79 mg/dL (ref 0.50–1.10)
GFR calc Af Amer: >90 mL/min (ref >90)
GFR calc non Af Amer: >90 mL/min (ref >90)
Glucose, Bld: 97 mg/dL (ref 70–99)
Potassium: 3.6 mEq/L (ref 3.5–5.1)
Sodium: 136 mEq/L (ref 135–145)

## 2012-04-05 MED ORDER — DIPHENHYDRAMINE HCL 25 MG PO CAPS
25.0000 mg | ORAL_CAPSULE | Freq: Four times a day (QID) | ORAL | Status: DC | PRN
  Administered 2012-04-05: 25 mg via ORAL

## 2012-04-05 MED ORDER — HYDROMORPHONE HCL PF 1 MG/ML IJ SOLN
1.0000 mg | Freq: Once | INTRAMUSCULAR | Status: AC
  Administered 2012-04-05: 1 mg via INTRAVENOUS
  Filled 2012-04-05: qty 1

## 2012-04-05 MED ORDER — DIPHENHYDRAMINE HCL 25 MG PO CAPS: 25.0000 mg | ORAL_CAPSULE | Freq: Four times a day (QID) | ORAL | Status: DC | PRN

## 2012-04-05 MED ORDER — CLINDAMYCIN HCL 150 MG PO CAPS: 450.0000 mg | ORAL_CAPSULE | Freq: Four times a day (QID) | ORAL | Status: DC

## 2012-04-05 MED ORDER — HYDROMORPHONE HCL PF 1 MG/ML IJ SOLN
0.5000 mg | Freq: Once | INTRAMUSCULAR | Status: AC
  Administered 2012-04-05: 0.5 mg via INTRAVENOUS
  Filled 2012-04-05: qty 1

## 2012-04-05 MED ORDER — IBUPROFEN 600 MG PO TABS: 600.0000 mg | ORAL_TABLET | Freq: Four times a day (QID) | ORAL | Status: DC | PRN

## 2012-04-05 MED ORDER — CLINDAMYCIN PHOSPHATE 900 MG/50ML IV SOLN
INTRAVENOUS | Status: AC
  Administered 2012-04-05: 900 mg
  Filled 2012-04-05: qty 50

## 2012-04-05 MED ORDER — ONDANSETRON HCL 4 MG/2ML IJ SOLN
4.0000 mg | Freq: Once | INTRAMUSCULAR | Status: AC
  Administered 2012-04-05: 4 mg via INTRAVENOUS
  Filled 2012-04-05: qty 2

## 2012-04-05 MED ORDER — OXYCODONE-ACETAMINOPHEN 5-325 MG PO TABS: 1.0000 | ORAL_TABLET | ORAL | Status: DC | PRN

## 2012-04-05 MED ORDER — OXYCODONE-ACETAMINOPHEN 5-325 MG PO TABS
2.0000 | ORAL_TABLET | Freq: Once | ORAL | Status: AC
  Administered 2012-04-05: 2 via ORAL
  Filled 2012-04-05: qty 2

## 2012-04-05 MED ORDER — DEXTROSE 5 % IV SOLN
900.0000 mg | Freq: Once | INTRAVENOUS | Status: AC
  Filled 2012-04-05: qty 6

## 2012-04-05 MED ORDER — DIPHENHYDRAMINE HCL 25 MG PO CAPS
ORAL_CAPSULE | ORAL | Status: AC
  Filled 2012-04-05: qty 1

## 2012-04-05 MED ORDER — KETOROLAC TROMETHAMINE 30 MG/ML IJ SOLN
15.0000 mg | Freq: Once | INTRAMUSCULAR | Status: AC
  Administered 2012-04-05: 15 mg via INTRAVENOUS
  Filled 2012-04-05: qty 1

## 2012-04-05 MED ORDER — IOHEXOL 300 MG/ML  SOLN
80.0000 mL | Freq: Once | INTRAMUSCULAR | Status: AC | PRN
  Administered 2012-04-05: 80 mL via INTRAVENOUS

## 2012-04-05 NOTE — ED Notes (Signed)
Pt states with toothache for months, woke up with facial swelling to right lower jaw, denies difficultly breathing, hard to swallow

## 2012-04-05 NOTE — ED Provider Notes (Signed)
History    This chart was scribed for Amy Skene, MD, MD by [name], ED Scribe. The patient was seen in room APA06 and the patient's care was started at 2:12PM.   CSN: [id number]  Arrival date & time 04/05/12  1239      Chief Complaint  Patient presents with  . Facial Swelling    (Consider location/radiation/quality/duration/timing/severity/associated sxs/prior treatment) The history is provided by the patient. No language interpreter was used.   [name] is a 21 y.o. female who presents to the Emergency Department complaining of constant, moderate right facial swelling onset today. Pt reports that she had right dental pain that has been intermittent. She reports current pain is 10/10. Reports that she has had dental pain in the past but not as severe as this and she has pain with swallowing. Denies any recent dental injury or surgery, SOB, chest pain, wheezing and difficulty breathing. Pt has current temperature of 99.3. Pt reports hx of bipolar 1 disorder and takes lithium carbonate.   Past Medical History  Diagnosis Date  . Bipolar 1 disorder   . Chronic back pain   . Depression     Past Surgical History  Procedure Date  . Cesarean section   . Tonsillectomy   . Addenoidectomy     No family history on file.  History  Substance Use Topics  . Smoking status: Current Every Day Smoker -- 0.2 packs/day for 3 years    Types: Cigarettes  . Smokeless tobacco: Not on file  . Alcohol Use: No    OB History    Grav Para Term Preterm Abortions TAB SAB Ect Mult Living                  Review of Systems At least 10pt or greater review of systems completed and are negative except where specified in the HPI.   Allergies  Review of patient's allergies indicates no known allergies.  Home Medications   Current Outpatient Rx  Name  Route  Sig  Dispense  Refill  . ETONOGESTREL 68 MG [place] IMPL   Subcutaneous   Inject 1 each into the skin once.         Marland Kitchen  LITHIUM CARBONATE 300 MG PO CAPS   Oral   Take 1 capsule (300 mg total) by mouth 2 (two) times daily. For mood control.   60 capsule   0   . ZOLPIDEM TARTRATE 5 MG PO TABS   Oral   Take 1 tablet (5 mg total) by mouth at bedtime as needed for sleep.   30 tablet   0     BP 123/77  Pulse 106  Temp 99.3 F (37.4 C) (Oral)  Resp 18  Ht 5\' 4"  (1.626 m)  SpO2 99%  Physical Exam  Nursing notes reviewed.  Electronic medical record reviewed. VITAL SIGNS:   Filed Vitals:   04/05/12 1242 04/05/12 1538 04/05/12 2016  BP: 123/77 126/75 113/52  Pulse: 106 88 91  Temp: 99.3 F (37.4 C)    TempSrc: Oral    Resp: 18 16 18   Height: 5\' 4"  (1.626 m)    SpO2: 99% 99% 97%   CONSTITUTIONAL: Awake, oriented, appears non-toxic HENT: Atraumatic, normocephalic, oral mucosa pink and moist, airway patent.  Right cheek swollen involving maxillary and mandibular areas. #29 broken, gingivitis.  Abscess around #29.  Nares patent without drainage. External ears normal. EYES: Conjunctiva clear, EOMI, PERRLA NECK: Trachea midline, non-tender, supple CARDIOVASCULAR: Normal heart rate, Normal  rhythm, No murmurs, rubs, gallops PULMONARY/CHEST: Clear to auscultation, no rhonchi, wheezes, or rales. Symmetrical breath sounds. Non-tender. ABDOMINAL: Non-distended, soft, non-tender - no rebound or guarding.  BS normal. NEUROLOGIC: Non-focal, moving all four extremities, no gross sensory or motor deficits. EXTREMITIES: No clubbing, cyanosis, or edema SKIN: Warm, Dry, No erythema, No rash  ED Course  Procedures (including critical care time) DIAGNOSTIC STUDIES: Oxygen Saturation is 99% on room air, normal by my interpretation.    COORDINATION OF CARE: 2:15 PM Discussed ED treatment with pt     Labs Reviewed  CBC WITH DIFFERENTIAL - Abnormal; Notable for the following:    WBC 12.9 (*)     Neutrophils Relative 79 (*)     Neutro Abs 10.2 (*)     All other components within normal limits  BASIC  METABOLIC PANEL - Abnormal; Notable for the following:    BUN 5 (*)     All other components within normal limits  LAB REPORT - SCANNED   Ct Soft Tissue Neck W Contrast  04/05/2012  *RADIOLOGY REPORT*  Clinical Data: Right-sided facial pain and swelling.  CT NECK WITH CONTRAST  Technique:  Multidetector CT imaging of the neck was performed with intravenous contrast.  Contrast: 80mL OMNIPAQUE IOHEXOL 300 MG/ML  SOLN  Comparison: None.  Findings: Limited visualization of the intracranial contents does not show any abnormality.  Lung apices are clear.  No superior mediastinal pathology.  Both parotid glands are normal.  Both submandibular glands are normal.  The thyroid gland is normal.  There is superficial soft tissue swelling/edema affecting the soft tissues of the right cheek and right face.  There is dental decay affecting the right sided maxillary and mandibular dentition, at least tooth number 15 and 16.  This is probably the genesis of the inflammation.  There may be a small adjacent soft tissue abscess within the gums.  There is a tongue piercing, but I do not see evidence of tongue inflammation.  There are some regional reactive lymph nodes but no suppurative or markedly enlarged nodes.  There is complete opacification of the left maxillary sinus and hypoplasia of the right maxillary sinus.  IMPRESSION: Infectious inflammation of the right side of the face, probably deriving from dental disease of the right maxilla and possibly the right mandible.  There is definite involvement at least of teeth numbers 15 and 16.  There may be a small gum abscess adjacent to the maxillary disease.  Other dental caries are also present.   Original Report Authenticated By: Paulina Fusi, M.D.      1. Dental abscess   2. Facial swelling       MDM  [name] is a 21 y.o. female presents with dental abscess.  Treated pain aggressively.  Airway patent.  Performed inferior alveolar block with fair results.  Expressed pus from #29 region of gums with moderate amount of purulent fluid.  Started clindamycin IV  D/W Dental on call for OMFS (no OMFS coverage) advised IND, pain control and antibiotics.  Pt stable OK for DC.  Pt felt apprehension toward DC, discussed lack of OMFS coverage or Dental on weekend, pt would need to go to [facility].  No jaw osteo or jaw abscess.  Abscess around tooth drained.  Pt pain is better - discussed home therapy and she was amenable to home therapy with dental follow up on Monday.  If she has any worsening problems she can return to ER - she understands and agrees to home  therapy with antibiotics and pain medicine for tooth abscess. Discussed with Hospitalist also, does not feel this an appropriate admission if pain is controlled, which it is. No MGUS.  I explained the diagnosis and have given explicit precautions to return to the ER including shortness of breath, difficulty breathing or any other new or worsening symptoms. The patient understands and accepts the medical plan as it's been dictated and I have answered their questions. Discharge instructions concerning home care and prescriptions have been given.  The patient is STABLE and is discharged to home in good condition.   I personally performed the services described in this documentation, which was scribed in my presence. The recorded information has been reviewed and is accurate. nt}   Amy Skene, MD 04/06/12 2213

## 2013-12-31 ENCOUNTER — Ambulatory Visit: Payer: Self-pay | Admitting: Adult Health

## 2014-01-20 ENCOUNTER — Ambulatory Visit: Payer: Self-pay | Admitting: Adult Health

## 2014-01-20 ENCOUNTER — Encounter: Payer: Self-pay | Admitting: *Deleted

## 2014-02-05 ENCOUNTER — Emergency Department (HOSPITAL_COMMUNITY)
Admission: EM | Admit: 2014-02-05 | Discharge: 2014-02-05 | Disposition: A | Discharge status: Home / Self Care | Payer: Self-pay | Attending: Emergency Medicine | Admitting: Emergency Medicine

## 2014-02-05 ENCOUNTER — Encounter (HOSPITAL_COMMUNITY): Payer: Self-pay | Admitting: Emergency Medicine

## 2014-02-05 ENCOUNTER — Emergency Department (HOSPITAL_COMMUNITY): Payer: Self-pay

## 2014-02-05 DIAGNOSIS — R51 Headache: Secondary | ICD-10-CM | POA: Insufficient documentation

## 2014-02-05 DIAGNOSIS — Z3202 Encounter for pregnancy test, result negative: Secondary | ICD-10-CM | POA: Insufficient documentation

## 2014-02-05 DIAGNOSIS — K029 Dental caries, unspecified: Secondary | ICD-10-CM | POA: Insufficient documentation

## 2014-02-05 DIAGNOSIS — G8929 Other chronic pain: Secondary | ICD-10-CM | POA: Insufficient documentation

## 2014-02-05 DIAGNOSIS — Z792 Long term (current) use of antibiotics: Secondary | ICD-10-CM | POA: Insufficient documentation

## 2014-02-05 DIAGNOSIS — F17200 Nicotine dependence, unspecified, uncomplicated: Secondary | ICD-10-CM | POA: Insufficient documentation

## 2014-02-05 DIAGNOSIS — F319 Bipolar disorder, unspecified: Secondary | ICD-10-CM | POA: Insufficient documentation

## 2014-02-05 DIAGNOSIS — R55 Syncope and collapse: Secondary | ICD-10-CM | POA: Insufficient documentation

## 2014-02-05 DIAGNOSIS — R42 Dizziness and giddiness: Secondary | ICD-10-CM | POA: Insufficient documentation

## 2014-02-05 DIAGNOSIS — Z79899 Other long term (current) drug therapy: Secondary | ICD-10-CM | POA: Insufficient documentation

## 2014-02-05 DIAGNOSIS — N390 Urinary tract infection, site not specified: Secondary | ICD-10-CM | POA: Insufficient documentation

## 2014-02-05 DIAGNOSIS — K047 Periapical abscess without sinus: Secondary | ICD-10-CM | POA: Insufficient documentation

## 2014-02-05 LAB — URINALYSIS, ROUTINE W REFLEX MICROSCOPIC
Bilirubin Urine: NEGATIVE
Glucose, UA: NEGATIVE mg/dL
Nitrite: NEGATIVE
Protein, ur: 30 mg/dL — AB
Specific Gravity, Urine: 1.01 (ref 1.005–1.030)
Urobilinogen, UA: 1 mg/dL (ref 0.0–1.0)
pH: 6 (ref 5.0–8.0)

## 2014-02-05 LAB — URINE MICROSCOPIC-ADD ON

## 2014-02-05 LAB — POC URINE PREG, ED: Preg Test, Ur: NEGATIVE

## 2014-02-05 MED ORDER — HYDROCODONE-ACETAMINOPHEN 5-325 MG PO TABS
1.0000 | ORAL_TABLET | Freq: Once | ORAL | Status: AC
  Administered 2014-02-05: 1 via ORAL
  Filled 2014-02-05: qty 1

## 2014-02-05 MED ORDER — AMOXICILLIN 500 MG PO CAPS: 500.0000 mg | ORAL_CAPSULE | Freq: Three times a day (TID) | ORAL | Status: DC

## 2014-02-05 MED ORDER — SULFAMETHOXAZOLE-TRIMETHOPRIM 800-160 MG PO TABS: 1.0000 | ORAL_TABLET | Freq: Two times a day (BID) | ORAL | Status: DC

## 2014-02-05 MED ORDER — HYDROCODONE-ACETAMINOPHEN 5-325 MG PO TABS: 1.0000 | ORAL_TABLET | ORAL | Status: DC | PRN

## 2014-02-05 MED ORDER — IBUPROFEN 800 MG PO TABS
800.0000 mg | ORAL_TABLET | Freq: Once | ORAL | Status: AC
  Administered 2014-02-05: 800 mg via ORAL
  Filled 2014-02-05: qty 1

## 2014-02-05 MED ORDER — AMOXICILLIN 250 MG PO CAPS
500.0000 mg | ORAL_CAPSULE | Freq: Once | ORAL | Status: AC
  Administered 2014-02-05: 500 mg via ORAL
  Filled 2014-02-05: qty 2

## 2014-02-05 NOTE — ED Notes (Signed)
Pt here with complaints of R. bottom tooth abscess that started last night. Also states that on Tues she passed out and hit head on the water heater. Has had headache ever since and wants that "checked out" as well.

## 2014-02-05 NOTE — ED Notes (Signed)
PT c/o headache since fall x2 days again and stated she hit the back of her head on a water heater. PT also c/o dental pain x1 day.

## 2014-02-05 NOTE — Discharge Instructions (Signed)
Dental Abscess A dental abscess is a collection of infected fluid (pus) from a bacterial infection in the inner part of the tooth (pulp). It usually occurs at the end of the tooth's root.  CAUSES   Severe tooth decay.  Trauma to the tooth that allows bacteria to enter into the pulp, such as a broken or chipped tooth. SYMPTOMS   Severe pain in and around the infected tooth.  Swelling and redness around the abscessed tooth or in the mouth or face.  Tenderness.  Pus drainage.  Bad breath.  Bitter taste in the mouth.  Difficulty swallowing.  Difficulty opening the mouth.  Nausea.  Vomiting.  Chills.  Swollen neck glands. DIAGNOSIS   A medical and dental history will be taken.  An examination will be performed by tapping on the abscessed tooth.  X-rays may be taken of the tooth to identify the abscess. TREATMENT The goal of treatment is to eliminate the infection. You may be prescribed antibiotic medicine to stop the infection from spreading. A root canal may be performed to save the tooth. If the tooth cannot be saved, it may be pulled (extracted) and the abscess may be drained.  HOME CARE INSTRUCTIONS  Only take over-the-counter or prescription medicines for pain, fever, or discomfort as directed by your caregiver.  Rinse your mouth (gargle) often with salt water ( tsp salt in 8 oz [250 ml] of warm water) to relieve pain or swelling.  Do not drive after taking pain medicine (narcotics).  Do not apply heat to the outside of your face.  Return to your dentist for further treatment as directed. SEEK MEDICAL CARE IF:  Your pain is not helped by medicine.  Your pain is getting worse instead of better. SEEK IMMEDIATE MEDICAL CARE IF:  You have a fever or persistent symptoms for more than 2-3 days.  You have a fever and your symptoms suddenly get worse.  You have chills or a very bad headache.  You have problems breathing or swallowing.  You have trouble  opening your mouth.  You have swelling in the neck or around the eye. Document Released: 05/15/2005 Document Revised: 02/07/2012 Document Reviewed: 08/23/2010 [facility] Patient Information 2015 [place], [place]. This information is not intended to replace advice given to you by your health care provider. Make sure you discuss any questions you have with your health care provider.  Syncope Syncope is a medical term for fainting or passing out. This means you lose consciousness and drop to the ground. People are generally unconscious for less than 5 minutes. You may have some muscle twitches for up to 15 seconds before waking up and returning to normal. Syncope occurs more often in older adults, but it can happen to anyone. While most causes of syncope are not dangerous, syncope can be a sign of a serious medical problem. It is important to seek medical care.  CAUSES  Syncope is caused by a sudden drop in blood flow to the brain. The specific cause is often not determined. Factors that can bring on syncope include:  Taking medicines that lower blood pressure.  Sudden changes in posture, such as standing up quickly.  Taking more medicine than prescribed.  Standing in one place for too long.  Seizure disorders.  Dehydration and excessive exposure to heat.  Low blood sugar (hypoglycemia).  Straining to have a bowel movement.  Heart disease, irregular heartbeat, or other circulatory problems.  Fear, emotional distress, seeing blood, or severe pain. SYMPTOMS  Right before fainting,  you may:  Feel dizzy or light-headed.  Feel nauseous.  See all white or all black in your field of vision.  Have cold, clammy skin. DIAGNOSIS  Your health care provider will ask about your symptoms, perform a physical exam, and perform an electrocardiogram (ECG) to record the electrical activity of your heart. Your health care provider may also perform other heart or blood tests to determine the cause of  your syncope which may include:  Transthoracic echocardiogram (TTE). During echocardiography, sound waves are used to evaluate how blood flows through your heart.  Transesophageal echocardiogram (TEE).  Cardiac monitoring. This allows your health care provider to monitor your heart rate and rhythm in real time.  Holter monitor. This is a portable device that records your heartbeat and can help diagnose heart arrhythmias. It allows your health care provider to track your heart activity for several days, if needed.  Stress tests by exercise or by giving medicine that makes the heart beat faster. TREATMENT  In most cases, no treatment is needed. Depending on the cause of your syncope, your health care provider may recommend changing or stopping some of your medicines. HOME CARE INSTRUCTIONS  Have someone stay with you until you feel stable.  Do not drive, use machinery, or play sports until your health care provider says it is okay.  Keep all follow-up appointments as directed by your health care provider.  Lie down right away if you start feeling like you might faint. Breathe deeply and steadily. Wait until all the symptoms have passed.  Drink enough fluids to keep your urine clear or pale yellow.  If you are taking blood pressure or heart medicine, get up slowly and take several minutes to sit and then stand. This can reduce dizziness. SEEK IMMEDIATE MEDICAL CARE IF:   You have a severe headache.  You have unusual pain in the chest, abdomen, or back.  You are bleeding from your mouth or rectum, or you have black or tarry stool.  You have an irregular or very fast heartbeat.  You have pain with breathing.  You have repeated fainting or seizure-like jerking during an episode.  You faint when sitting or lying down.  You have confusion.  You have trouble walking.  You have severe weakness.  You have vision problems. If you fainted, call your local emergency services (911  in [place] Do not drive yourself to the hospital.  MAKE SURE YOU:  Understand these instructions.  Will watch your condition.  Will get help right away if you are not doing well or get worse. Document Released: 05/15/2005 Document Revised: 05/20/2013 Document Reviewed: 07/14/2011 [facility] Patient Information 2015 [place], [place]. This information is not intended to replace advice given to you by your health care provider. Make sure you discuss any questions you have with your health care provider.  Urinary Tract Infection Urinary tract infections (UTIs) can develop anywhere along your urinary tract. Your urinary tract is your body's drainage system for removing wastes and extra water. Your urinary tract includes two kidneys, two ureters, a bladder, and a urethra. Your kidneys are a pair of bean-shaped organs. Each kidney is about the size of your fist. They are located below your ribs, one on each side of your spine. CAUSES Infections are caused by microbes, which are microscopic organisms, including fungi, viruses, and bacteria. These organisms are so small that they can only be seen through a microscope. Bacteria are the microbes that most commonly cause UTIs. SYMPTOMS  Symptoms of UTIs  may vary by age and gender of the patient and by the location of the infection. Symptoms in young women typically include a frequent and intense urge to urinate and a painful, burning feeling in the bladder or urethra during urination. Older women and men are more likely to be tired, shaky, and weak and have muscle aches and abdominal pain. A fever may mean the infection is in your kidneys. Other symptoms of a kidney infection include pain in your back or sides below the ribs, nausea, and vomiting. DIAGNOSIS To diagnose a UTI, your caregiver will ask you about your symptoms. Your caregiver also will ask to provide a urine sample. The urine sample will be tested for bacteria and white blood cells. White blood cells  are made by your body to help fight infection. TREATMENT  Typically, UTIs can be treated with medication. Because most UTIs are caused by a bacterial infection, they usually can be treated with the use of antibiotics. The choice of antibiotic and length of treatment depend on your symptoms and the type of bacteria causing your infection. HOME CARE INSTRUCTIONS  If you were prescribed antibiotics, take them exactly as your caregiver instructs you. Finish the medication even if you feel better after you have only taken some of the medication.  Drink enough water and fluids to keep your urine clear or pale yellow.  Avoid caffeine, tea, and carbonated beverages. They tend to irritate your bladder.  Empty your bladder often. Avoid holding urine for long periods of time.  Empty your bladder before and after sexual intercourse.  After a bowel movement, women should cleanse from front to back. Use each tissue only once. SEEK MEDICAL CARE IF:   You have back pain.  You develop a fever.  Your symptoms do not begin to resolve within 3 days. SEEK IMMEDIATE MEDICAL CARE IF:   You have severe back pain or lower abdominal pain.  You develop chills.  You have nausea or vomiting.  You have continued burning or discomfort with urination. MAKE SURE YOU:   Understand these instructions.  Will watch your condition.  Will get help right away if you are not doing well or get worse. Document Released: 02/22/2005 Document Revised: 11/14/2011 Document Reviewed: 06/23/2011 [facility] Patient Information 2015 [place], [place]. This information is not intended to replace advice given to you by your health care provider. Make sure you discuss any questions you have with your health care provider.    Urinary Tract Infection Urinary tract infections (UTIs) can develop anywhere along your urinary tract. Your urinary tract is your body's drainage system for removing wastes and extra water. Your urinary tract  includes two kidneys, two ureters, a bladder, and a urethra. Your kidneys are a pair of bean-shaped organs. Each kidney is about the size of your fist. They are located below your ribs, one on each side of your spine. CAUSES Infections are caused by microbes, which are microscopic organisms, including fungi, viruses, and bacteria. These organisms are so small that they can only be seen through a microscope. Bacteria are the microbes that most commonly cause UTIs. SYMPTOMS  Symptoms of UTIs may vary by age and gender of the patient and by the location of the infection. Symptoms in young women typically include a frequent and intense urge to urinate and a painful, burning feeling in the bladder or urethra during urination. Older women and men are more likely to be tired, shaky, and weak and have muscle aches and abdominal pain. A fever  may mean the infection is in your kidneys. Other symptoms of a kidney infection include pain in your back or sides below the ribs, nausea, and vomiting. DIAGNOSIS To diagnose a UTI, your caregiver will ask you about your symptoms. Your caregiver also will ask to provide a urine sample. The urine sample will be tested for bacteria and white blood cells. White blood cells are made by your body to help fight infection. TREATMENT  Typically, UTIs can be treated with medication. Because most UTIs are caused by a bacterial infection, they usually can be treated with the use of antibiotics. The choice of antibiotic and length of treatment depend on your symptoms and the type of bacteria causing your infection. HOME CARE INSTRUCTIONS  If you were prescribed antibiotics, take them exactly as your caregiver instructs you. Finish the medication even if you feel better after you have only taken some of the medication.  Drink enough water and fluids to keep your urine clear or pale yellow.  Avoid caffeine, tea, and carbonated beverages. They tend to irritate your bladder.  Empty  your bladder often. Avoid holding urine for long periods of time.  Empty your bladder before and after sexual intercourse.  After a bowel movement, women should cleanse from front to back. Use each tissue only once. SEEK MEDICAL CARE IF:   You have back pain.  You develop a fever.  Your symptoms do not begin to resolve within 3 days. SEEK IMMEDIATE MEDICAL CARE IF:   You have severe back pain or lower abdominal pain.  You develop chills.  You have nausea or vomiting.  You have continued burning or discomfort with urination. MAKE SURE YOU:   Understand these instructions.  Will watch your condition.  Will get help right away if you are not doing well or get worse. Document Released: 02/22/2005 Document Revised: 11/14/2011 Document Reviewed: 06/23/2011 [facility] Patient Information 2015 [place], [place]. This information is not intended to replace advice given to you by your health care provider. Make sure you discuss any questions you have with your health care provider.   Take the antibiotics as prescribed.  Make sure you are drinking plenty of fluids and getting plenty of rest.  Get rechecked if you develop fevers, back pain or uncontrolled vomiting.  See the referral numbers for obtaining primary medical care and dental care as discussed.  Do not drive within 4 hours of taking the pain medication as this will make you drowsy.  You will need to have a repeat urinalysis after your antibiotics are completed.    Emergency Department Resource Guide 1) Find a Doctor and Pay Out of Pocket Although you won't have to find out who is covered by your insurance plan, it is a good idea to ask around and get recommendations. You will then need to call the office and see if the doctor you have chosen will accept you as a new patient and what types of options they offer for patients who are self-pay. Some doctors offer discounts or will set up payment plans for their patients who do not have  insurance, but you will need to ask so you aren't surprised when you get to your appointment.  2) Contact Your Local Health Department Not all health departments have doctors that can see patients for sick visits, but many do, so it is worth a call to see if yours does. If you don't know where your local health department is, you can check in your phone book. The CDC  also has a tool to help you locate your state's health department, and many state websites also have listings of all of their local health departments.  3) Find a Walk-in Clinic If your illness is not likely to be very severe or complicated, you may want to try a walk in clinic. These are popping up all over the country in pharmacies, drugstores, and shopping centers. They're usually staffed by nurse practitioners or physician assistants that have been trained to treat common illnesses and complaints. They're usually fairly quick and inexpensive. However, if you have serious medical issues or chronic medical problems, these are probably not your best option.  No Primary Care Doctor: - Call Health Connect at  [phone] - they can help you locate a primary care doctor that  accepts your insurance, provides certain services, etc. - Physician Referral Service- [phone]  Chronic Pain Problems: Organization         Address  Phone   Notes  [facility]  [phone] Patients need to be referred by their primary care doctor.   Medication Assistance: Organization         Address  Phone   Notes  [facility] Medication [facility] [address] [place] [place], [place] [address] [phone] --Must be a resident of [facility] -- Must have NO insurance coverage whatsoever (no Medicaid/ Medicare, etc.) -- The pt. MUST have a primary care doctor that directs their care regularly and follows them in the community   [facility]  [phone]   [employer]  [phone]    Agencies that provide  inexpensive medical care: Organization         Address  Phone   Notes  [facility]  [phone]   [facility]    [phone]   [facility] [address] [place], [place] [address] [phone]   [facility] [address] [place] [place] [phone]   Planned Parenthood    [phone]   [facility]    [phone]   [facility]  [address], [place] Phone:  [phone], Fax:  [phone] Hours of Operation:  9 am - 6 pm, M-F.  Also accepts Medicaid/Medicare and self-pay.  [facility]  [address], [place] Phone: [phone], Fax: [phone]. Hours of Operation:  8:30 am - 5:30 pm, M-F.  Also accepts Medicaid and self-pay.  [facility] [place] [address], [place] Phone: [phone]   [facility] [address] Natasha Bence [place], [place] [phone], Ext. [phone] Mondays & Thursdays: 7-9 AM.  First 15 patients are seen on a first come, first serve basis.    Medicaid-accepting [facility] Providers:  Organization         Address  Phone   Notes  [facility] [address], [place] [phone] Also accepts self-pay patients.  [facility] [address] Laurell Josephs [place], [place]  [phone]   [facility] [address], [place] [phone]   [facility] [address], [place] [phone]   Renaye Rakers [address], [place]   [phone] Only accepts [place] patients after they have their name applied to their card.   Self-Pay (no insurance) in  [facility]:  Organization         Address  Phone   Notes  Sickle Cell Patients, [facility] [address] [place], [place] [phone]   [facility]  Care [address] [place], [place] [phone]   [facility] [place]  [address], [place] [phone]   [facility]/Dr. Osei-Bonsu  [address], [place] or [address], [place] [phone] Phone number for both [place] and [place] locations is the same.  [facility] [address] [place] [phone]   [facility] [address], [place] or [address] [phone]   [facility] [address], [place] [phone], phone; [phone], fax Sees patients 1st and 3rd Saturday of every month.  Must not qualify for public or private insurance (i.e. Medicaid, Medicare, [place] Health Choice, Veterans' Benefits)  Household income should be no more than 200% of the poverty level The clinic cannot treat you if you are pregnant or think you are pregnant  Sexually transmitted diseases are not treated at the clinic.    Dental Care: Organization         Address  Phone  Notes  [facility] Department of [facility] [address] [place], [place] [phone] Accepts children up to age 57 who are enrolled in [place] or [place] Health Choice; pregnant women with a Medicaid card; and children who have applied for Medicaid or [place] Health Choice, but were declined, whose parents can pay a reduced fee at time of service.  [facility] Department of [facility]  [address], [place] [phone] Accepts children up to age 44 who are enrolled in [place] or [place] Health Choice; pregnant women with a Medicaid card; and children who have applied for Medicaid or [place] Health Choice, but were declined, whose parents can pay a reduced fee at time of service.  [facility]  [address] [place], [place] [phone] Patients are seen by appointment only. Walk-ins are  not accepted. [facility] will see patients 54 years of age and older. Monday - Tuesday (8am-5pm) Most Wednesdays (8:30-5pm) $30 per visit, cash only  [facility] Adult Dental Access [facility]  [address], [facility] [phone] Patients are seen by appointment only. Walk-ins are not accepted. [facility] will see patients 68 years of age and older. One Wednesday Evening (Monthly: Volunteer Based).  $30 per visit, cash only  [employer]  [phone] for adults; Children under age 61, call Graduate Pediatric Dentistry at [phone]. Children aged 87-14, please call [phone] to request a pediatric application.  Dental services are provided in all areas of dental care including fillings, crowns and bridges, complete and partial dentures, implants, gum treatment, root canals, and extractions. Preventive care is also provided. Treatment is provided to both adults and children. Patients are selected via a lottery and there is often a waiting list.   [facility] [address] [place]  [phone] [email or web address]   [facility] [address] [place], [place] [phone], Ext. [phone] Second and Fourth Thursday of each month, opens at 6:30 AM; Clinic ends at 9 AM.  Patients are seen on a first-come first-served basis, and a limited number are seen during each  clinic.   [facility]  [address] Ether Griffins [place], [place] [phone]   Eligibility Requirements You must have lived in [place], [place], or [place] counties for at least the last three months.   You cannot be eligible for state or federal sponsored [place], including [employer], [place], or [employer].   You generally cannot be eligible for healthcare insurance through your employer.    How to apply: Eligibility screenings are held every Tuesday and Wednesday afternoon from 1:00 pm until 4:00 pm. You do not need an appointment for  the interview!  [facility] [address], [place], [place] [phone]   [facility] Health Department  [phone]   [facility] Health Department  [phone]   [facility] Health Department  [phone]    Behavioral Health Resources in the Community: Intensive Outpatient Programs Organization         Address  Phone  Notes  [facility] [address] [place], [place] [phone]   [facility] [address], [place], [place] [phone]   [facility] [address], [place], [place]  [phone]   [facility] Mental Health [address]  [place], [place] [phone] or [phone]   Substance Abuse Resources Organization         Address  Phone  Notes  [facility]  [phone]   [facility]  [phone]   [facility]  [phone]   Floydene Flock  [phone]   Residential & Outpatient Substance Abuse Program  [phone]   Psychological Services Organization         Address  Phone  Notes  [facility] Behavioral Health  [phone]   [facility]  [phone]   [facility] [address], [place] [phone] or [phone]    Mobile Crisis Teams Organization         Address  Phone  Notes  [facility], Mobile Crisis Care Unit  [phone]   [facility]  [address] [place], [place] [phone]   Doristine Locks [address] [place] [phone]    Self-Help/Support Groups Organization         Address  Phone             Notes  [facility] [place] - variety of support groups  [phone]- [phone] Call for more information  Narcotics Anonymous (NA), [facility] [address], [employer] [place]  2 meetings at this location   Statistician         Address  Phone  Notes  [facility]  [address],    [place]  [phone]   [facility] [place]  [address] [place] [address], [place], [place] [phone]   [facility] [address] [place], [place] [phone] Admissions: 8am-3pm M-F  [facility] [address]    [place], [place] [phone]   [facility] [address] [place], [place] [phone]   [facility] [address]  [place], [place] [phone]   [facility] [address], [place], [place] [phone]   [facility] [address] [place]  [place], [place] [phone] or [phone]   [facility] [address], [place], [place] [phone] Accepts Medicaid  [facility] [address],  [place]  [phone] Substance Abuse/Addiction Treatment   [facility]         Address  Phone  Notes  [facility]  [phone]   Domenic Schwab, PhD [address] [place], [place]   [phone] or [phone]   [place], [place] [phone]   [address], [place], [place] [phone] Insurance/Medicaid/sponsorship through [facility] and Families [address], [address] [place]                                    [place], [place] [phone] [place] [address], [place] [phone]    Dr. Adele Schilder  [phone]   [facility] 1) [address], [place] 2) [place] 3)  [place] [address], [place] [phone]  [phone]   [place] [phone] or [phone] (After Hours)

## 2014-02-05 NOTE — ED Provider Notes (Signed)
CSN: [id number]     Arrival date & time 02/05/14  1347 History   First MD Initiated Contact with Patient 02/05/14 1357     Chief Complaint  Patient presents with  . Abscess     (Consider location/radiation/quality/duration/timing/severity/associated sxs/prior Treatment) The history is provided by the patient.   [name] is a 23 y.o. female presenting with several complaints.  The first being a dental abscess which has been intermittent problem for her but flared up again yesterday evening.  She describes dental pain along with gingival swelling without fevers or chills and no drainage from around the infection site.  Secondly, describes a head injury which occurred 2 days ago.  She was at a friend's home and was lying on the couch.  She stood up and walked into the kitchen and promptly had a syncopal event, falling and hitting her posterior head.  Since then she has had persistent generalized headache along with intermittent nausea and lightheadedness.  She denies nausea or vomiting and has had no focal weakness, no chest pain, palpitations, shortness of breath.  She has found no alleviators for her headache pain as well.     Past Medical History  Diagnosis Date  . Bipolar 1 disorder   . Chronic back pain   . Depression    Past Surgical History  Procedure Laterality Date  . Cesarean section    . Tonsillectomy    . Addenoidectomy     History reviewed. No pertinent family history. History  Substance Use Topics  . Smoking status: Current Every Day Smoker -- 0.25 packs/day for 3 years    Types: Cigarettes  . Smokeless tobacco: Not on file  . Alcohol Use: No   OB History   Grav Para Term Preterm Abortions TAB SAB Ect Mult Living                 Review of Systems  Constitutional: Negative for fever and chills.  HENT: Positive for dental problem. Negative for facial swelling and sore throat.   Respiratory: Negative for chest tightness and shortness of breath.     Cardiovascular: Negative for chest pain, palpitations and leg swelling.  Musculoskeletal: Negative for neck pain and neck stiffness.  Neurological: Positive for syncope, light-headedness and headaches. Negative for seizures, speech difficulty and weakness.      Allergies  Review of patient's allergies indicates no known allergies.  Home Medications   Prior to Admission medications   Medication Sig Start Date End Date Taking? Authorizing Provider  aspirin-acetaminophen-caffeine (EXCEDRIN MIGRAINE) [phone] MG per tablet Take 2 tablets by mouth every 8 (eight) hours as needed for headache or migraine.   Yes Historical Provider, MD  etonogestrel (IMPLANON) 68 MG IMPL implant Inject 1 each into the skin once.   Yes Historical Provider, MD  amoxicillin (AMOXIL) 500 MG capsule Take 1 capsule (500 mg total) by mouth 3 (three) times daily. 02/05/14   Burgess Amor, PA-C  HYDROcodone-acetaminophen (NORCO/VICODIN) 5-325 MG per tablet Take 1 tablet by mouth every 4 (four) hours as needed. 02/05/14   Burgess Amor, PA-C  lithium carbonate 300 MG capsule Take 1 capsule (300 mg total) by mouth 2 (two) times daily. For mood control. 07/10/11 05/05/12  Mike Craze, MD  sulfamethoxazole-trimethoprim (SEPTRA DS) 800-160 MG per tablet Take 1 tablet by mouth every 12 (twelve) hours. 02/05/14   Burgess Amor, PA-C   BP 129/86  Pulse 88  Temp(Src) 98.6 F (37 C) (Oral)  Resp 18  Ht  (  1.575 m)  Wt 140 lb (63.504 kg)  BMI 25.60 kg/m2  SpO2 99%  LMP 01/27/2014 Physical Exam  Nursing note and vitals reviewed. Constitutional: She is oriented to person, place, and time. She appears well-developed and well-nourished. No distress.  HENT:  Head: Normocephalic and atraumatic.  Right Ear: Tympanic membrane and external ear normal.  Left Ear: Tympanic membrane and external ear normal.  Mouth/Throat: Oropharynx is clear and moist and mucous membranes are normal. No oral lesions. Dental abscesses present.    Pain  with nonfluctuant edema along right lower gingival line.  She has a cavity on her posterior aspect of her second premolar which I suspect is the source of the infection.  There is no drainage of pus.  No cheek induration or erythema.  She does have a slightly enlarged and tender right submandibular lymph node.  Eyes: Conjunctivae and EOM are normal. Pupils are equal, round, and reactive to light.  Neck: Normal range of motion. Neck supple.  Cardiovascular: Normal rate and normal heart sounds.   Pulmonary/Chest: Effort normal.  Abdominal: Soft. She exhibits no distension. There is no tenderness.  Musculoskeletal: Normal range of motion.  Lymphadenopathy:    She has no cervical adenopathy.  Neurological: She is alert and oriented to person, place, and time. She has normal strength. No cranial nerve deficit or sensory deficit. Gait normal. GCS eye subscore is 4. GCS verbal subscore is 5. GCS motor subscore is 6.  Cranial nerves III-XII intact.  No pronator drift.  Equal grip strength  Skin: Skin is warm and dry. No rash noted. No erythema.  Psychiatric: She has a normal mood and affect. Her speech is normal and behavior is normal. Thought content normal. Cognition and memory are normal.    ED Course  Procedures (including critical care time) Labs Review Labs Reviewed  URINALYSIS, ROUTINE W REFLEX MICROSCOPIC - Abnormal; Notable for the following:    Color, Urine AMBER (*)    APPearance HAZY (*)    Hgb urine dipstick LARGE (*)    Ketones, ur TRACE (*)    Protein, ur 30 (*)    Leukocytes, UA TRACE (*)    All other components within normal limits  URINE MICROSCOPIC-ADD ON - Abnormal; Notable for the following:    Squamous Epithelial / LPF MANY (*)    Bacteria, UA MANY (*)    All other components within normal limits  URINE CULTURE  POC URINE PREG, ED    Imaging Review Ct Head Wo Contrast  02/05/2014   CLINICAL DATA:  Larey Seat backwards 2 days previous with head injury  EXAM: CT HEAD  WITHOUT CONTRAST  TECHNIQUE: Contiguous axial images were obtained from the base of the skull through the vertex without intravenous contrast.  COMPARISON:  04/05/2012  FINDINGS: The bony calvarium is intact. No gross soft tissue abnormality is noted. No findings to suggest acute hemorrhage, acute infarction or space-occupying mass lesion are seen.  IMPRESSION: No acute abnormality noted.   Electronically Signed   By: Alcide Clever M.D.   On: 02/05/2014 15:06     EKG Interpretation None      Date: 02/05/2014  Rate: 75  Rhythm: normal sinus rhythm  QRS Axis: normal  Intervals: normal  ST/T Wave abnormalities: normal  Conduction Disutrbances:none  Narrative Interpretation:   Old EKG Reviewed: none available    MDM   Final diagnoses:  Dental abscess  Syncope, unspecified syncope type  UTI (lower urinary tract infection)    Patients labs and/or radiological  studies were viewed and considered during the medical decision making and disposition process. Patient with a dental abscess not amenable to I&D at this time.  She was prescribed amoxicillin for this infection. She was also prescribed a five-day course of Bactrim for her UTI, urine culture was ordered.  Prescribed hydrocodone for pain relief.  She was given referrals for followup care.  The patient appears reasonably screened and/or stabilized for discharge and I doubt any other medical condition or other [facility] requiring further screening, evaluation, or treatment in the ED at this time prior to discharge.     Burgess Amor, PA-C 02/05/14 1712  Burgess Amor, PA-C 02/05/14 1726

## 2014-02-07 LAB — URINE CULTURE
Colony Count: NO GROWTH
Culture: NO GROWTH
Special Requests: NORMAL

## 2014-02-07 NOTE — ED Provider Notes (Signed)
Medical screening examination/treatment/procedure(s) were performed by non-physician practitioner and as supervising physician I was immediately available for consultation/collaboration.   EKG Interpretation None       Donnetta Hutching, MD 02/07/14 [phone]

## 2014-06-18 ENCOUNTER — Encounter (HOSPITAL_COMMUNITY): Payer: Self-pay

## 2014-06-18 ENCOUNTER — Emergency Department (HOSPITAL_COMMUNITY)
Admission: EM | Admit: 2014-06-18 | Discharge: 2014-06-18 | Disposition: A | Discharge status: Home / Self Care | Payer: Self-pay | Attending: Emergency Medicine | Admitting: Emergency Medicine

## 2014-06-18 ENCOUNTER — Emergency Department (HOSPITAL_COMMUNITY): Payer: Self-pay

## 2014-06-18 DIAGNOSIS — N739 Female pelvic inflammatory disease, unspecified: Secondary | ICD-10-CM | POA: Insufficient documentation

## 2014-06-18 DIAGNOSIS — Z8659 Personal history of other mental and behavioral disorders: Secondary | ICD-10-CM | POA: Insufficient documentation

## 2014-06-18 DIAGNOSIS — G8929 Other chronic pain: Secondary | ICD-10-CM | POA: Insufficient documentation

## 2014-06-18 DIAGNOSIS — Z720 Tobacco use: Secondary | ICD-10-CM | POA: Insufficient documentation

## 2014-06-18 DIAGNOSIS — Z79899 Other long term (current) drug therapy: Secondary | ICD-10-CM | POA: Insufficient documentation

## 2014-06-18 DIAGNOSIS — N898 Other specified noninflammatory disorders of vagina: Secondary | ICD-10-CM | POA: Insufficient documentation

## 2014-06-18 DIAGNOSIS — R1110 Vomiting, unspecified: Secondary | ICD-10-CM | POA: Insufficient documentation

## 2014-06-18 DIAGNOSIS — Z792 Long term (current) use of antibiotics: Secondary | ICD-10-CM | POA: Insufficient documentation

## 2014-06-18 DIAGNOSIS — Z3202 Encounter for pregnancy test, result negative: Secondary | ICD-10-CM | POA: Insufficient documentation

## 2014-06-18 DIAGNOSIS — R109 Unspecified abdominal pain: Secondary | ICD-10-CM

## 2014-06-18 LAB — CBC WITH DIFFERENTIAL/PLATELET
Basophils Absolute: 0 10*3/uL (ref 0.0–0.1)
Basophils Relative: 0 % (ref 0–1)
Eosinophils Absolute: 0.3 10*3/uL (ref 0.0–0.7)
Eosinophils Relative: 3 % (ref 0–5)
HCT: 33 % — ABNORMAL LOW (ref 36.0–46.0)
Hemoglobin: 10.9 g/dL — ABNORMAL LOW (ref 12.0–15.0)
Lymphocytes Relative: 19 % (ref 12–46)
Lymphs Abs: 1.5 10*3/uL (ref 0.7–4.0)
MCH: 28.5 pg (ref 26.0–34.0)
MCHC: 33 g/dL (ref 30.0–36.0)
MCV: 86.2 fL (ref 78.0–100.0)
Monocytes Absolute: 0.6 10*3/uL (ref 0.1–1.0)
Monocytes Relative: 7 % (ref 3–12)
Neutro Abs: 5.9 10*3/uL (ref 1.7–7.7)
Neutrophils Relative %: 71 % (ref 43–77)
Platelets: 267 10*3/uL (ref 150–400)
RBC: 3.83 MIL/uL — ABNORMAL LOW (ref 3.87–5.11)
RDW: 12.9 % (ref 11.5–15.5)
WBC: 8.2 10*3/uL (ref 4.0–10.5)

## 2014-06-18 LAB — BASIC METABOLIC PANEL
Anion gap: 4 — ABNORMAL LOW (ref 5–15)
BUN: 11 mg/dL (ref 6–23)
CO2: 29 mmol/L (ref 19–32)
Calcium: 9 mg/dL (ref 8.4–10.5)
Chloride: 107 mEq/L (ref 96–112)
Creatinine, Ser: 0.78 mg/dL (ref 0.50–1.10)
GFR calc Af Amer: >90 mL/min (ref >90)
GFR calc non Af Amer: >90 mL/min (ref >90)
Glucose, Bld: 99 mg/dL (ref 70–99)
Potassium: 4.2 mmol/L (ref 3.5–5.1)
Sodium: 140 mmol/L (ref 135–145)

## 2014-06-18 LAB — WET PREP, GENITAL
Trich, Wet Prep: NONE SEEN
Yeast Wet Prep HPF POC: NONE SEEN

## 2014-06-18 LAB — URINALYSIS, ROUTINE W REFLEX MICROSCOPIC
Bilirubin Urine: NEGATIVE
Glucose, UA: NEGATIVE mg/dL
Hgb urine dipstick: NEGATIVE
Ketones, ur: NEGATIVE mg/dL
Leukocytes, UA: NEGATIVE
Nitrite: NEGATIVE
Protein, ur: NEGATIVE mg/dL
Specific Gravity, Urine: 1.02 (ref 1.005–1.030)
Urobilinogen, UA: 1 mg/dL (ref 0.0–1.0)
pH: 6.5 (ref 5.0–8.0)

## 2014-06-18 LAB — PREGNANCY, URINE: Preg Test, Ur: NEGATIVE

## 2014-06-18 MED ORDER — METRONIDAZOLE 500 MG PO TABS: 500.0000 mg | ORAL_TABLET | Freq: Two times a day (BID) | ORAL | Status: DC

## 2014-06-18 MED ORDER — ONDANSETRON HCL 4 MG/2ML IJ SOLN
4.0000 mg | Freq: Once | INTRAMUSCULAR | Status: AC
  Administered 2014-06-18: 4 mg via INTRAVENOUS
  Filled 2014-06-18: qty 2

## 2014-06-18 MED ORDER — HYDROMORPHONE HCL 1 MG/ML IJ SOLN
1.0000 mg | Freq: Once | INTRAMUSCULAR | Status: AC
  Administered 2014-06-18: 1 mg via INTRAVENOUS
  Filled 2014-06-18: qty 1

## 2014-06-18 MED ORDER — ONDANSETRON HCL 4 MG PO TABS: 4.0000 mg | ORAL_TABLET | Freq: Four times a day (QID) | ORAL | Status: DC

## 2014-06-18 MED ORDER — DOXYCYCLINE HYCLATE 100 MG PO CAPS: 100.0000 mg | ORAL_CAPSULE | Freq: Two times a day (BID) | ORAL | Status: DC

## 2014-06-18 MED ORDER — AZITHROMYCIN 250 MG PO TABS
1000.0000 mg | ORAL_TABLET | Freq: Once | ORAL | Status: AC
  Administered 2014-06-18: 1000 mg via ORAL
  Filled 2014-06-18: qty 4

## 2014-06-18 MED ORDER — CEFTRIAXONE SODIUM 1 G IJ SOLR
1.0000 g | Freq: Once | INTRAMUSCULAR | Status: AC
  Administered 2014-06-18: 1 g via INTRAVENOUS
  Filled 2014-06-18: qty 10

## 2014-06-18 MED ORDER — IOHEXOL 300 MG/ML  SOLN
100.0000 mL | Freq: Once | INTRAMUSCULAR | Status: AC | PRN
  Administered 2014-06-18: 100 mL via INTRAVENOUS

## 2014-06-18 MED ORDER — HYDROCODONE-ACETAMINOPHEN 5-325 MG PO TABS: 1.0000 | ORAL_TABLET | ORAL | Status: DC | PRN

## 2014-06-18 MED ORDER — IOHEXOL 300 MG/ML  SOLN
25.0000 mL | Freq: Once | INTRAMUSCULAR | Status: AC | PRN
  Administered 2014-06-18: 25 mL via ORAL

## 2014-06-18 NOTE — ED Notes (Signed)
Vomited undigested food onto floor .  Alert,

## 2014-06-18 NOTE — ED Notes (Signed)
Pt c/o lower abd pain  X 2 days.  Reports LMP was in Nov.  Says has taken 2 pregnancy tests at home and both have been negative.  Pt says has had more discharge than usual but no color or odor.  Denies pain or burning with urination.

## 2014-06-18 NOTE — Discharge Instructions (Signed)
Be sure to schedule an appointment with [facility] to have your implant removed and discuss another form of birth control return here as needed for worsening symptoms. Do not take the narcotic pain medication if you are driving as it will make you sleepy.

## 2014-06-18 NOTE — ED Provider Notes (Signed)
CSN: [id number]     Arrival date & time 06/18/14  1701 History   First MD Initiated Contact with Patient 06/18/14 1756     Chief Complaint  Patient presents with  . Abdominal Pain     (Consider location/radiation/quality/duration/timing/severity/associated sxs/prior Treatment) Patient is a 24 y.o. female presenting with abdominal pain. The history is provided by the patient.  Abdominal Pain Pain location:  LLQ and RLQ Pain quality: cramping and sharp   Pain radiates to:  Perineum Pain severity:  Moderate Onset quality:  Gradual Duration:  2 days Timing:  Constant Progression:  Worsening Chronicity:  New Relieved by: sleeping. Ineffective treatments:  None tried Associated symptoms: fatigue, nausea and vaginal discharge   Associated symptoms: no anorexia, no belching, no chest pain, no chills, no constipation, no cough, no diarrhea, no dysuria, no fever, no hematemesis, no hematuria, no shortness of breath, no sore throat, no vaginal bleeding and no vomiting    [name] is a 24 y.o. G3 P3, LMP 04/10/14. Implant for birth control but it expired one year ago. Current sex partner x 7 years, unprotected sex. Hx of Chlamydia 7 years ago. Last [facility] about 3 years ago [facility]   Past Medical History  Diagnosis Date  . Bipolar 1 disorder   . Chronic back pain   . Depression    Past Surgical History  Procedure Laterality Date  . Cesarean section    . Tonsillectomy    . Addenoidectomy     No family history on file. History  Substance Use Topics  . Smoking status: Current Every Day Smoker -- 0.25 packs/day for 3 years    Types: Cigarettes  . Smokeless tobacco: Not on file  . Alcohol Use: No   OB History    No data available     Review of Systems  Constitutional: Positive for fatigue. Negative for fever and chills.  HENT: Negative for sore throat.   Respiratory: Negative for cough and shortness of breath.   Cardiovascular: Negative for  chest pain.  Gastrointestinal: Positive for nausea and abdominal pain. Negative for vomiting, diarrhea, constipation, anorexia and hematemesis.  Genitourinary: Positive for vaginal discharge. Negative for dysuria, hematuria and vaginal bleeding.  all other systems negative    Allergies  Review of patient's allergies indicates no known allergies.  Home Medications   Prior to Admission medications   Medication Sig Start Date End Date Taking? Authorizing Provider  aspirin-acetaminophen-caffeine (EXCEDRIN MIGRAINE) [phone] MG per tablet Take 2 tablets by mouth every 8 (eight) hours as needed for headache or migraine.    Historical Provider, MD  doxycycline (VIBRAMYCIN) 100 MG capsule Take 1 capsule (100 mg total) by mouth 2 (two) times daily. 06/18/14   Hope Orlene OchM Neese, NP  etonogestrel (IMPLANON) 68 MG IMPL implant Inject 1 each into the skin once.    Historical Provider, MD  HYDROcodone-acetaminophen (NORCO/VICODIN) 5-325 MG per tablet Take 1 tablet by mouth every 4 (four) hours as needed. 06/18/14   Hope Orlene OchM Neese, NP  lithium carbonate 300 MG capsule Take 1 capsule (300 mg total) by mouth 2 (two) times daily. For mood control. Patient not taking: Reported on 06/18/2014 07/10/11 05/05/12  Mike CrazeEdwin O Walker, MD  metroNIDAZOLE (FLAGYL) 500 MG tablet Take 1 tablet (500 mg total) by mouth 2 (two) times daily. 06/18/14   Hope Orlene OchM Neese, NP  ondansetron (ZOFRAN) 4 MG tablet Take 1 tablet (4 mg total) by mouth every 6 (six) hours. 06/18/14  Hope Orlene Och, NP   BP 104/78 mmHg  Pulse 74  Temp(Src) 98.3 F (36.8 C) (Oral)  Resp 20  Ht  (1.6 m)  Wt 159 lb (72.122 kg)  BMI 28.17 kg/m2  SpO2 100%  LMP 04/23/2014 Physical Exam  Constitutional: She is oriented to person, place, and time. She appears well-developed and well-nourished.  HENT:  Head: Normocephalic.  Eyes: EOM are normal.  Neck: Neck supple.  Cardiovascular: Normal rate.   Pulmonary/Chest: Effort normal.  Abdominal: Soft. Bowel sounds  are normal. There is tenderness in the right lower quadrant, suprapubic area and left lower quadrant. There is no rigidity, no rebound, no guarding and no CVA tenderness.  Genitourinary:  External genitalia without lesions, white d/c vaginal vault, positive CMT, bilateral adnexal tenderness, uterus without palpable enlargement.   Musculoskeletal: Normal range of motion.  Neurological: She is alert and oriented to person, place, and time. No cranial nerve deficit.  Skin: Skin is warm and dry.  Psychiatric: She has a normal mood and affect. Her behavior is normal.  Nursing note and vitals reviewed.   ED Course  Procedures (including critical care time) IV fluids, zofran, Rocephin 1 gram IV, Zithromax 1 gram PO Labs Review Results for orders placed or performed during the hospital encounter of 06/18/14 (from the past 24 hour(s))  Pregnancy, urine     Status: None   Collection Time: 06/18/14  5:10 PM  Result Value Ref Range   Preg Test, Ur NEGATIVE NEGATIVE  Urinalysis, Routine w reflex microscopic     Status: None   Collection Time: 06/18/14  5:10 PM  Result Value Ref Range   Color, Urine YELLOW YELLOW   APPearance CLEAR CLEAR   Specific Gravity, Urine 1.020 1.005 - 1.030   pH 6.5 5.0 - 8.0   Glucose, UA NEGATIVE NEGATIVE mg/dL   Hgb urine dipstick NEGATIVE NEGATIVE   Bilirubin Urine NEGATIVE NEGATIVE   Ketones, ur NEGATIVE NEGATIVE mg/dL   Protein, ur NEGATIVE NEGATIVE mg/dL   Urobilinogen, UA 1.0 0.0 - 1.0 mg/dL   Nitrite NEGATIVE NEGATIVE   Leukocytes, UA NEGATIVE NEGATIVE  CBC with Differential     Status: Abnormal   Collection Time: 06/18/14  6:17 PM  Result Value Ref Range   WBC 8.2 4.0 - 10.5 K/uL   RBC 3.83 (L) 3.87 - 5.11 MIL/uL   Hemoglobin 10.9 (L) 12.0 - 15.0 g/dL   HCT 16.1 (L) 09.6 - 04.5 %   MCV 86.2 78.0 - 100.0 fL   MCH 28.5 26.0 - 34.0 pg   MCHC 33.0 30.0 - 36.0 g/dL   RDW 40.9 81.1 - 91.4 %   Platelets 267 150 - 400 K/uL   Neutrophils Relative % 71 43 -  77 %   Neutro Abs 5.9 1.7 - 7.7 K/uL   Lymphocytes Relative 19 12 - 46 %   Lymphs Abs 1.5 0.7 - 4.0 K/uL   Monocytes Relative 7 3 - 12 %   Monocytes Absolute 0.6 0.1 - 1.0 K/uL   Eosinophils Relative 3 0 - 5 %   Eosinophils Absolute 0.3 0.0 - 0.7 K/uL   Basophils Relative 0 0 - 1 %   Basophils Absolute 0.0 0.0 - 0.1 K/uL  Basic metabolic panel     Status: Abnormal   Collection Time: 06/18/14  6:17 PM  Result Value Ref Range   Sodium 140 135 - 145 mmol/L   Potassium 4.2 3.5 - 5.1 mmol/L   Chloride 107 96 - 112 mEq/L  CO2 29 19 - 32 mmol/L   Glucose, Bld 99 70 - 99 mg/dL   BUN 11 6 - 23 mg/dL   Creatinine, Ser 1.61 0.50 - 1.10 mg/dL   Calcium 9.0 8.4 - 09.6 mg/dL   GFR calc non Af Amer >90 >90 mL/min   GFR calc Af Amer >90 >90 mL/min   Anion gap 4 (L) 5 - 15  Wet prep, genital     Status: Abnormal   Collection Time: 06/18/14  7:45 PM  Result Value Ref Range   Yeast Wet Prep HPF POC NONE SEEN NONE SEEN   Trich, Wet Prep NONE SEEN NONE SEEN   Clue Cells Wet Prep HPF POC FEW (A) NONE SEEN   WBC, Wet Prep HPF POC FEW (A) NONE SEEN    Ct Abdomen Pelvis W Contrast  06/18/2014   CLINICAL DATA:  24 year old female with 2 day history of lower abdominal pain.  EXAM: CT ABDOMEN AND PELVIS WITH CONTRAST  TECHNIQUE: Multidetector CT imaging of the abdomen and pelvis was performed using the standard protocol following bolus administration of intravenous contrast.  CONTRAST:  25mL OMNIPAQUE IOHEXOL 300 MG/ML SOLN, OMNIPAQUE IOHEXOL 300 MG/ML SOLN  COMPARISON:  None.  FINDINGS: Lower Chest: The lung bases are clear. Visualized cardiac structures are within normal limits for size. No pericardial effusion. Unremarkable visualized distal thoracic esophagus.  Abdomen: Unremarkable CT appearance of the stomach, duodenum, spleen, adrenal glands and pancreas. Normal hepatic contours and morphology. No discrete hepatic abnormality. Gallbladder is unremarkable. No intra or extrahepatic biliary ductal  dilatation. Unremarkable appearance of the bilateral kidneys. No focal solid lesion, hydronephrosis or nephrolithiasis.  No evidence of obstruction or focal bowel wall thickening. Normal appendix in the right lower quadrant. The terminal ileum is unremarkable.  Pelvis: 3 cm dominant follicular cyst affiliated with the left ovary. The right ovary is unremarkable. Trace free fluid in the pelvis is likely physiologic. Unremarkable appearance of the bladder.  Bones/Soft Tissues: No acute fracture or aggressive appearing lytic or blastic osseous lesion.  Vascular: No significant atherosclerotic vascular disease, aneurysmal dilatation or acute abnormality.  IMPRESSION: 1. No acute abnormality in the abdomen or pelvis. The appendix is normal. 2. A 3 cm dominant left ovarian follicular cyst noted incidentally. This could cause left lower quadrant pain. 3. Trace free fluid in the pelvis is likely physiologic in a reproductive age female.   Electronically Signed   By: Malachy Moan M.D.   On: 06/18/2014 20:09     MDM  24 y.o. female with pelvic pain and increased vaginal discharge x 2 days. Will treat for PID and she will follow up with Dr. Emelda Fear. She will return here as needed for worsening symptoms. Stable for discharge without fever at this time and no TOA, torsion or appendicitis on CT. I have reviewed this patient's vital signs, nurses notes, appropriate labs and imaging.  I have discussed findings and plan of care with the patient and she voices understanding and agrees with plan. Patient feeling much better at time of d/c.    Final diagnoses:  Abdominal pain  PID (pelvic inflammatory disease)    Janne Napoleon, NP 06/19/14 1547  Raeford Razor, MD 06/19/14 1600

## 2014-06-20 LAB — HIV ANTIBODY (ROUTINE TESTING W REFLEX)
HIV 1/O/2 Abs-Index Value: <1 (ref <1.00)
HIV-1/HIV-2 Ab: NONREACTIVE

## 2014-06-20 LAB — RPR: RPR Ser Ql: NONREACTIVE

## 2014-06-22 LAB — GC/CHLAMYDIA PROBE AMP (~~LOC~~) NOT AT ARMC
Chlamydia: NEGATIVE
Neisseria Gonorrhea: NEGATIVE

## 2014-07-16 ENCOUNTER — Emergency Department (HOSPITAL_COMMUNITY): Payer: Self-pay

## 2014-07-16 ENCOUNTER — Emergency Department (HOSPITAL_COMMUNITY)
Admission: EM | Admit: 2014-07-16 | Discharge: 2014-07-16 | Discharge status: Against Medical Advice | Payer: Self-pay | Attending: Emergency Medicine | Admitting: Emergency Medicine

## 2014-07-16 ENCOUNTER — Encounter (HOSPITAL_COMMUNITY): Payer: Self-pay

## 2014-07-16 DIAGNOSIS — M25562 Pain in left knee: Secondary | ICD-10-CM | POA: Insufficient documentation

## 2014-07-16 DIAGNOSIS — G8929 Other chronic pain: Secondary | ICD-10-CM | POA: Insufficient documentation

## 2014-07-16 DIAGNOSIS — Z720 Tobacco use: Secondary | ICD-10-CM | POA: Insufficient documentation

## 2014-07-16 NOTE — ED Notes (Signed)
Called for room. Not in waiting room.

## 2014-07-16 NOTE — ED Notes (Signed)
Pt reports pain in left knee that started approx 1 hour ago when she stood up from using the bathroom.  Pt says her knee "just gave out."

## 2014-07-16 NOTE — ED Notes (Signed)
Pt called for room x1. No answer in waiting room.

## 2014-12-27 IMAGING — CT CT HEAD W/O CM
1 series · 16 of 30 positions shown, 20 images · non-contrast
Comparison: 04/05/2012

CLINICAL DATA: Fell backwards 2 days previous with head injury

EXAM:
CT HEAD WITHOUT CONTRAST
TECHNIQUE: Contiguous axial images were obtained from the base of the skull
through the vertex without intravenous contrast.

[Series 2: headtrauma 4.8 h37s · axial · 0.43mm/px · z∈[+38,+190]mm · 16 of 36 slices shown, 20 images]
[im 2/36  brain]
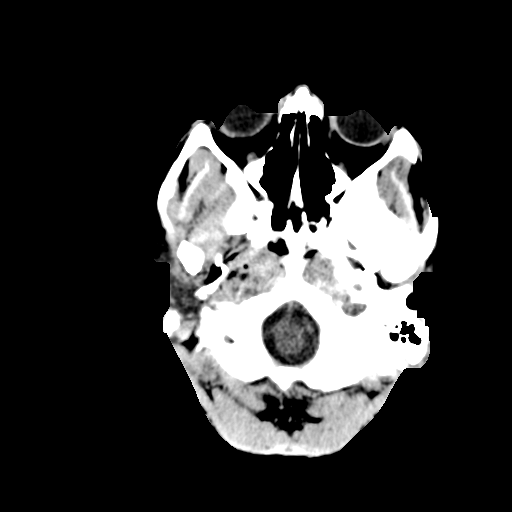
[im 2/36  bone]
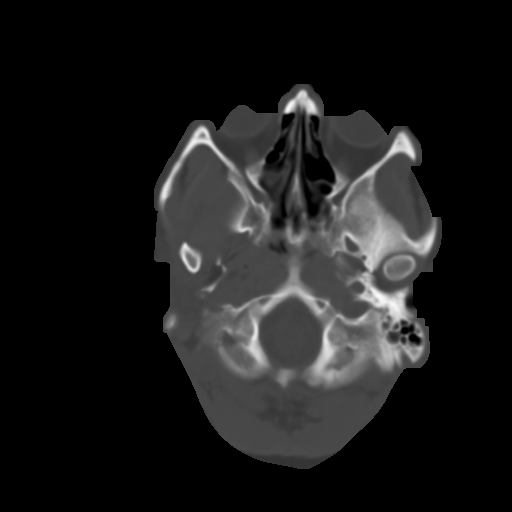
[im 4/36  brain]
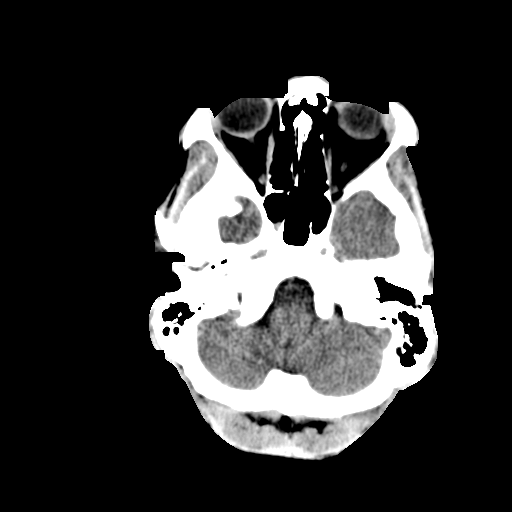
[im 7/36  brain]
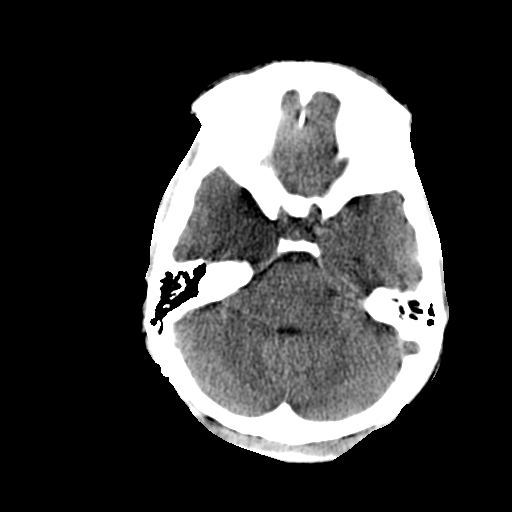
[im 9/36  brain]
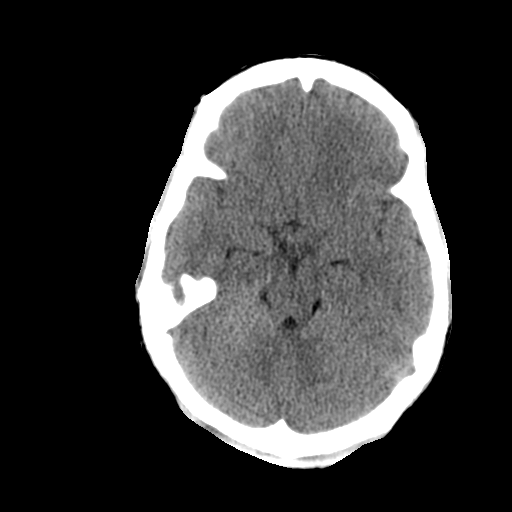
[im 10/36  brain]
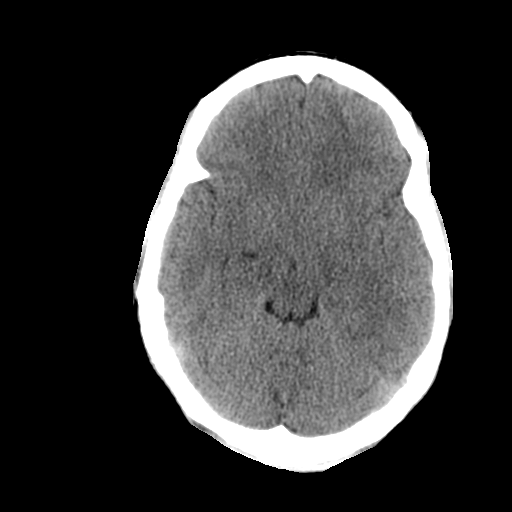
[im 10/36  bone]
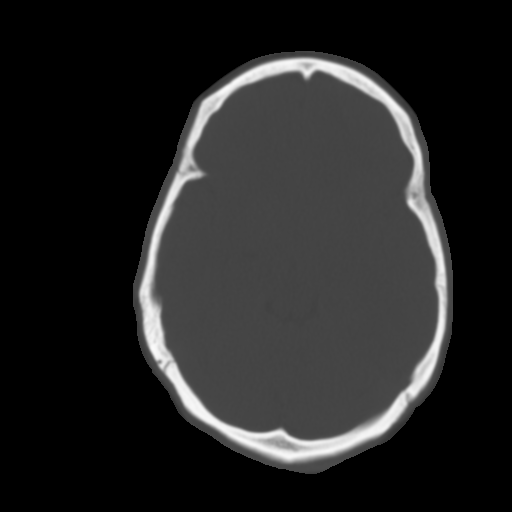
[im 13/36  brain]
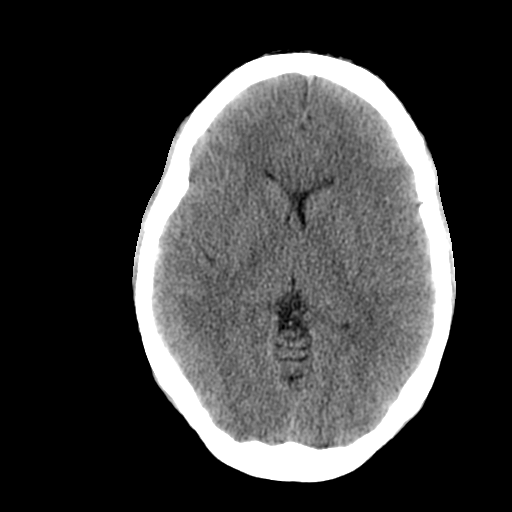
[im 15/36  brain]
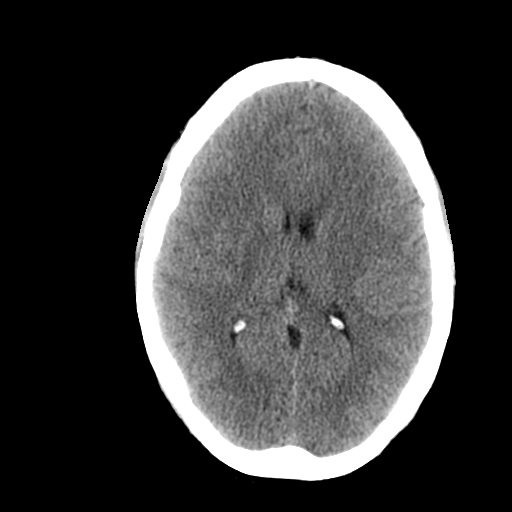
[im 17/36  brain]
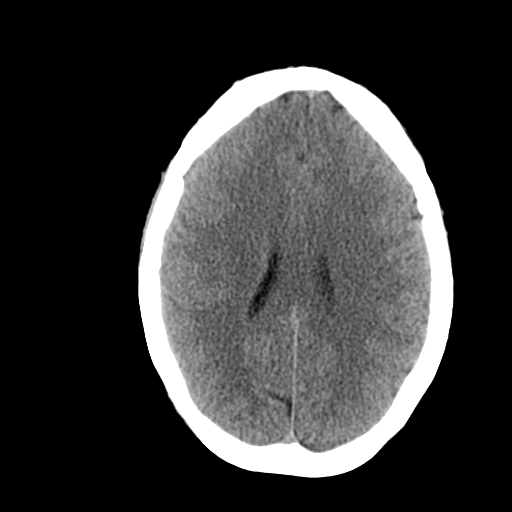
[im 19/36  brain]
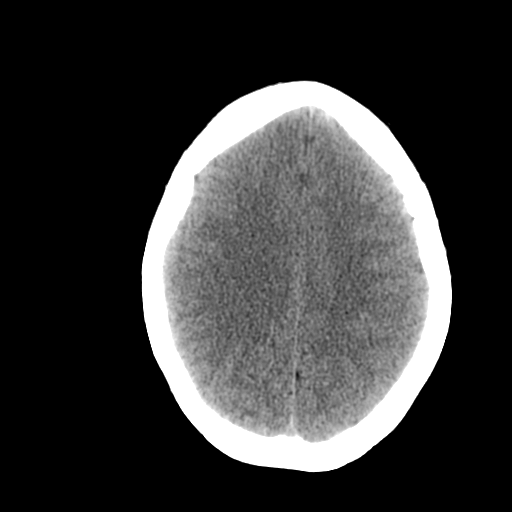
[im 19/36  bone]
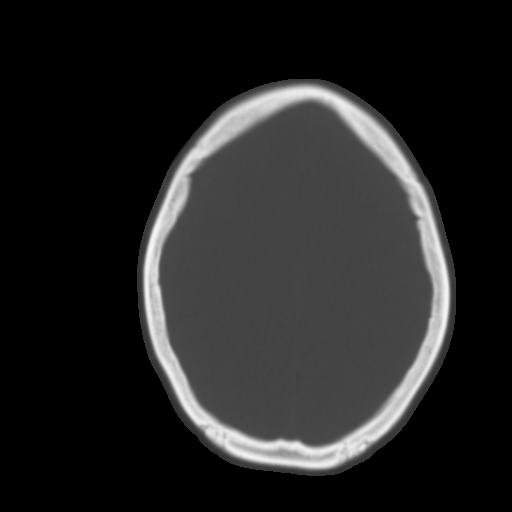
[im 21/36  brain]
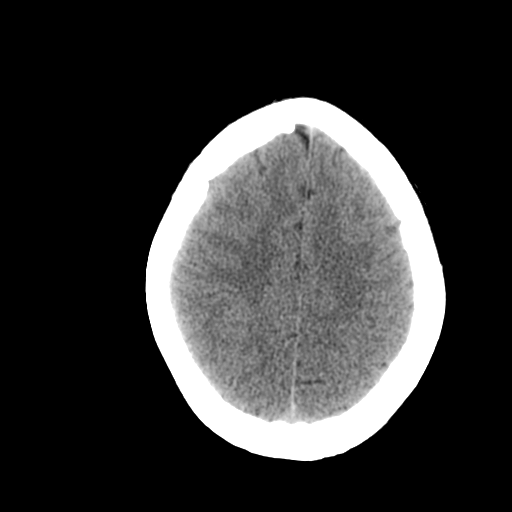
[im 23/36  brain]
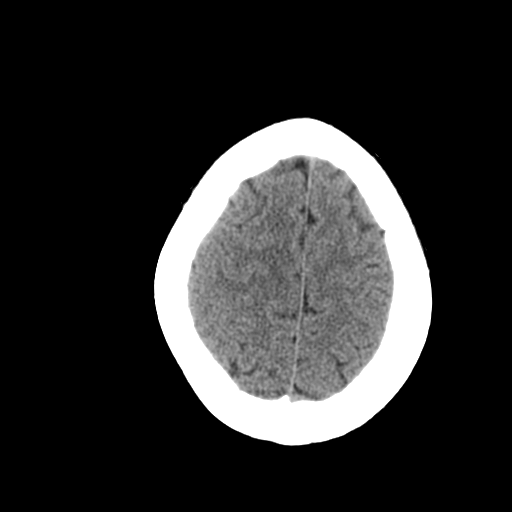
[im 26/36  brain]
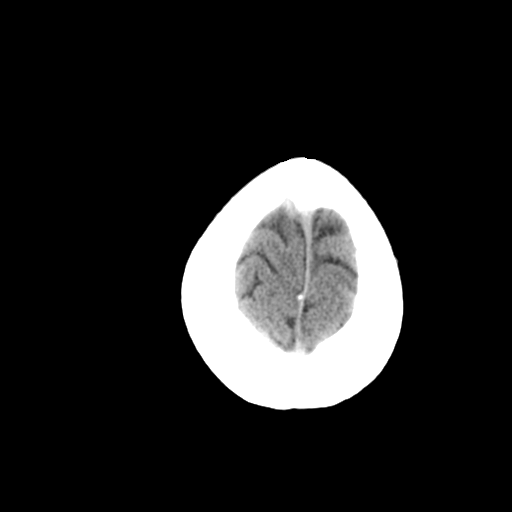
[im 27/36  brain]
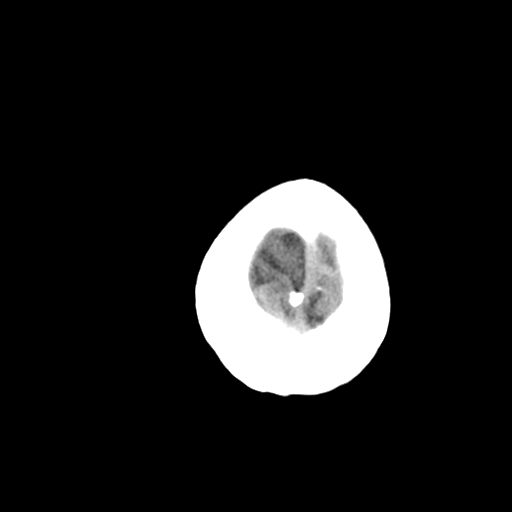
[im 27/36  bone]
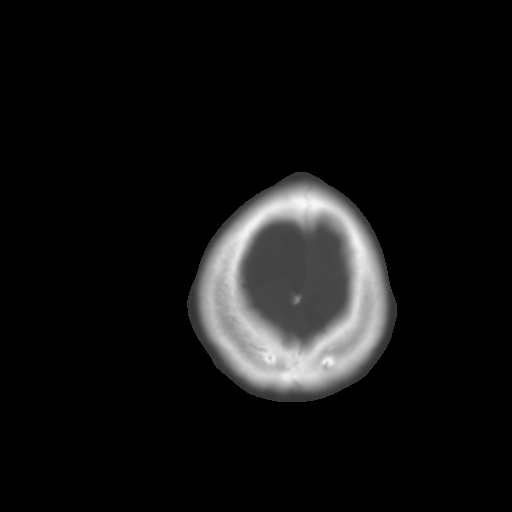
[im 29/36  brain]
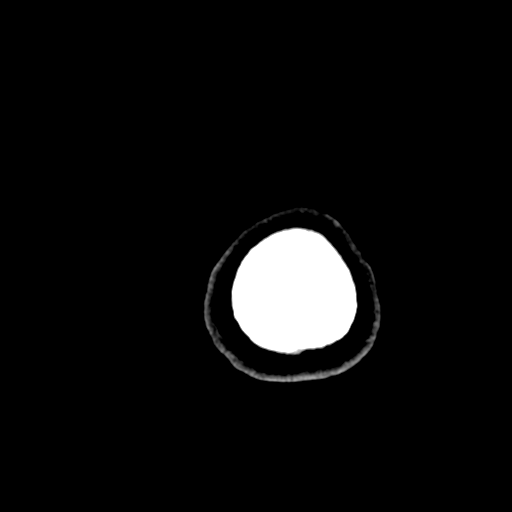
[im 32/36  brain]
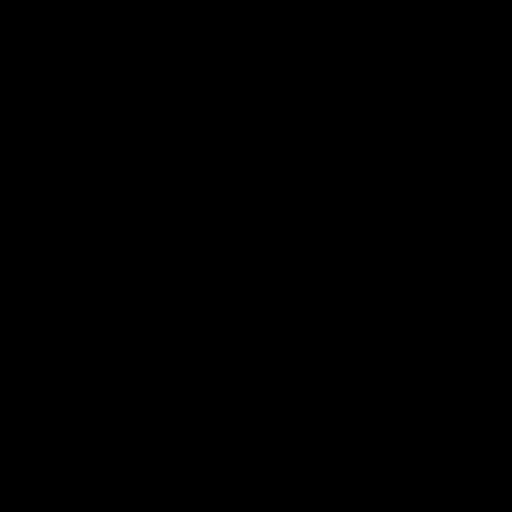
[im 34/36  brain]
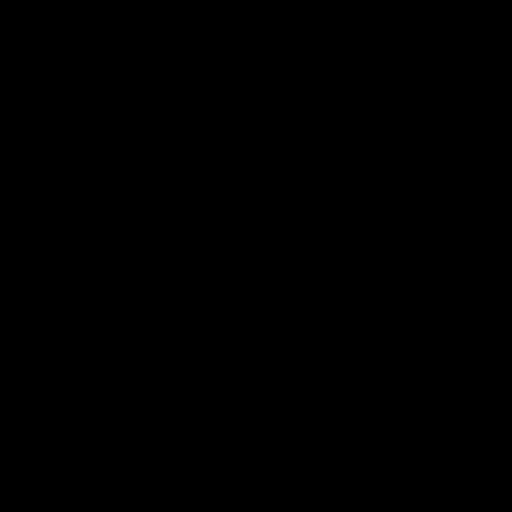

[16 of 30 positions shown; findings below may reference images not displayed]

FINDINGS: The bony calvarium is intact. No gross soft tissue abnormality is
noted. No findings to suggest acute hemorrhage, acute infarction or
space-occupying mass lesion are seen.
IMPRESSION: No acute abnormality noted.

## 2015-08-27 ENCOUNTER — Encounter (HOSPITAL_COMMUNITY): Payer: Self-pay | Admitting: Emergency Medicine

## 2015-08-27 ENCOUNTER — Emergency Department (HOSPITAL_COMMUNITY)
Admission: EM | Admit: 2015-08-27 | Discharge: 2015-08-27 | Disposition: A | Discharge status: Home / Self Care | Payer: Self-pay | Attending: Emergency Medicine | Admitting: Emergency Medicine

## 2015-08-27 DIAGNOSIS — F319 Bipolar disorder, unspecified: Secondary | ICD-10-CM | POA: Insufficient documentation

## 2015-08-27 DIAGNOSIS — Z7982 Long term (current) use of aspirin: Secondary | ICD-10-CM | POA: Insufficient documentation

## 2015-08-27 DIAGNOSIS — Z79899 Other long term (current) drug therapy: Secondary | ICD-10-CM | POA: Insufficient documentation

## 2015-08-27 DIAGNOSIS — N730 Acute parametritis and pelvic cellulitis: Secondary | ICD-10-CM | POA: Insufficient documentation

## 2015-08-27 DIAGNOSIS — F17210 Nicotine dependence, cigarettes, uncomplicated: Secondary | ICD-10-CM | POA: Insufficient documentation

## 2015-08-27 LAB — URINE MICROSCOPIC-ADD ON: RBC / HPF: NONE SEEN RBC/hpf (ref 0–5)

## 2015-08-27 LAB — URINALYSIS, ROUTINE W REFLEX MICROSCOPIC
Bilirubin Urine: NEGATIVE
Glucose, UA: NEGATIVE mg/dL
Hgb urine dipstick: NEGATIVE
Ketones, ur: 15 mg/dL — AB
Nitrite: NEGATIVE
Protein, ur: NEGATIVE mg/dL
Specific Gravity, Urine: 1.02 (ref 1.005–1.030)
pH: 6.5 (ref 5.0–8.0)

## 2015-08-27 LAB — WET PREP, GENITAL
Sperm: NONE SEEN
Yeast Wet Prep HPF POC: NONE SEEN

## 2015-08-27 LAB — PREGNANCY, URINE: Preg Test, Ur: NEGATIVE

## 2015-08-27 MED ORDER — CEFTRIAXONE SODIUM 250 MG IJ SOLR
250.0000 mg | Freq: Once | INTRAMUSCULAR | Status: AC
  Administered 2015-08-27: 250 mg via INTRAMUSCULAR
  Filled 2015-08-27: qty 250

## 2015-08-27 MED ORDER — AZITHROMYCIN 250 MG PO TABS: ORAL_TABLET | ORAL | Status: DC

## 2015-08-27 MED ORDER — AZITHROMYCIN 250 MG PO TABS
1000.0000 mg | ORAL_TABLET | Freq: Once | ORAL | Status: AC
  Administered 2015-08-27: 1000 mg via ORAL
  Filled 2015-08-27: qty 4

## 2015-08-27 MED ORDER — HYDROCODONE-ACETAMINOPHEN 5-325 MG PO TABS: 1.0000 | ORAL_TABLET | Freq: Four times a day (QID) | ORAL | Status: DC | PRN

## 2015-08-27 MED ORDER — METRONIDAZOLE 500 MG PO TABS: 500.0000 mg | ORAL_TABLET | Freq: Two times a day (BID) | ORAL | Status: DC

## 2015-08-27 NOTE — ED Notes (Signed)
Pt alert & oriented x4, stable gait. Patient given discharge instructions, paperwork & prescription(s). Patient informed not to drive, operate any equipment & handel any important documents 4 hours after taking pain medication. Patient  instructed to stop at the registration desk to finish any additional paperwork. Patient  verbalized understanding. Pt left department w/ no further questions. 

## 2015-08-27 NOTE — Discharge Instructions (Signed)
Follow up with the health department next week °

## 2015-08-27 NOTE — ED Notes (Signed)
Pelvic cart ready. 

## 2015-08-27 NOTE — ED Notes (Signed)
MD at bedside. 

## 2015-08-27 NOTE — ED Provider Notes (Signed)
CSN: [id number]     Arrival date & time 08/27/15  1402 History   First MD Initiated Contact with Patient 08/27/15 1640     Chief Complaint  Patient presents with  . Vaginal Itching     (Consider location/radiation/quality/duration/timing/severity/associated sxs/prior Treatment) Patient is a 25 y.o. female presenting with vaginal itching. The history is provided by the patient (Patient complains of vaginal discharge and pain around her vagina).  Vaginal Itching This is a new problem. The current episode started more than 2 days ago. The problem occurs constantly. The problem has not changed since onset.Pertinent negatives include no chest pain, no abdominal pain and no headaches. Nothing aggravates the symptoms. Nothing relieves the symptoms.    Past Medical History  Diagnosis Date  . Bipolar 1 disorder (HCC)   . Chronic back pain   . Depression    Past Surgical History  Procedure Laterality Date  . Cesarean section    . Tonsillectomy    . Addenoidectomy     History reviewed. No pertinent family history. Social History  Substance Use Topics  . Smoking status: Current Every Day Smoker -- 0.25 packs/day for 3 years    Types: Cigarettes  . Smokeless tobacco: None  . Alcohol Use: No   OB History    Gravida Para Term Preterm AB TAB SAB Ectopic Multiple Living   3         3     Review of Systems  Constitutional: Negative for appetite change and fatigue.  HENT: Negative for congestion, ear discharge and sinus pressure.   Eyes: Negative for discharge.  Respiratory: Negative for cough.   Cardiovascular: Negative for chest pain.  Gastrointestinal: Negative for abdominal pain and diarrhea.  Genitourinary: Positive for vaginal discharge. Negative for frequency and hematuria.  Musculoskeletal: Negative for back pain.  Skin: Negative for rash.  Neurological: Negative for seizures and headaches.  Psychiatric/Behavioral: Negative for hallucinations.      Allergies  Review of  patient's allergies indicates no known allergies.  Home Medications   Prior to Admission medications   Medication Sig Start Date End Date Taking? Authorizing Provider  aspirin-acetaminophen-caffeine (EXCEDRIN MIGRAINE) [phone] [id number] MG per tablet Take 2 tablets by mouth every 8 (eight) hours as needed for headache or migraine.   Yes Historical Provider, MD  etonogestrel (IMPLANON) 68 MG IMPL implant Inject 1 each into the skin once.   Yes Historical Provider, MD  azithromycin (ZITHROMAX) 250 MG tablet Take all four tablets on 09/02/15 08/27/15   Bethann BerkshireJoseph Zammit, MD  HYDROcodone-acetaminophen (NORCO/VICODIN) 5-325 MG tablet Take 1 tablet by mouth every 6 (six) hours as needed. 08/27/15   Bethann BerkshireJoseph Zammit, MD  metroNIDAZOLE (FLAGYL) 500 MG tablet Take 1 tablet (500 mg total) by mouth 2 (two) times daily. One po bid x 7 days 08/27/15   Bethann BerkshireJoseph Zammit, MD   BP 136/79 mmHg  Pulse 98  Temp(Src) 98.5 F (36.9 C) (Oral)  Resp 16  SpO2 100%  LMP 07/30/2015 Physical Exam  Constitutional: She is oriented to person, place, and time. She appears well-developed.  HENT:  Head: Normocephalic.  Eyes: Conjunctivae and EOM are normal. No scleral icterus.  Neck: Neck supple. No thyromegaly present.  Cardiovascular: Normal rate and regular rhythm.  Exam reveals no gallop and no friction rub.   No murmur heard. Pulmonary/Chest: No stridor. She has no wheezes. She has no rales. She exhibits no tenderness.  Abdominal: She exhibits no distension. There is no tenderness. There is no rebound.  Genitourinary:  Patient with  whitish yellow discharge and tenderness to the cervix  Musculoskeletal: Normal range of motion. She exhibits no edema.  Lymphadenopathy:    She has no cervical adenopathy.  Neurological: She is oriented to person, place, and time. She exhibits normal muscle tone. Coordination normal.  Skin: No rash noted. No erythema.  Psychiatric: She has a normal mood and affect. Her behavior is normal.    ED  Course  Procedures (including critical care time) Labs Review Labs Reviewed  WET PREP, GENITAL - Abnormal; Notable for the following:    Trich, Wet Prep PRESENT (*)    Clue Cells Wet Prep HPF POC PRESENT (*)    WBC, Wet Prep HPF POC FEW (*)    All other components within normal limits  URINALYSIS, ROUTINE W REFLEX MICROSCOPIC (NOT AT [facility]) - Abnormal; Notable for the following:    Ketones, ur 15 (*)    Leukocytes, UA SMALL (*)    All other components within normal limits  URINE MICROSCOPIC-ADD ON - Abnormal; Notable for the following:    Squamous Epithelial / LPF TOO NUMEROUS TO COUNT (*)    Bacteria, UA FEW (*)    All other components within normal limits  PREGNANCY, URINE  GC/CHLAMYDIA PROBE AMP ([place]) NOT AT [facility]    Imaging Review No results found. I have personally reviewed and evaluated these images and lab results as part of my medical decision-making.   EKG Interpretation None      MDM   Final diagnoses:  PID (acute pelvic inflammatory disease)    Patient with vaginal discharge consistent with PID. Also wet prep shows Trichomonas and vaginosis. Patient given Zithromax and Rocephin and a prescription for more Zithromax in a week Flagyl Vicodin and she is to follow-up with the health department next week    Bethann Berkshire, MD 08/27/15 1932

## 2015-08-27 NOTE — ED Notes (Signed)
PT states vaginal discomfort with itching and white discharge at times. PT also states rash/bumps to perineal area after shaving x1 week ago. Pt denies any urinary symptoms or abdominal pain.

## 2015-08-30 LAB — GC/CHLAMYDIA PROBE AMP (~~LOC~~) NOT AT ARMC
Chlamydia: POSITIVE — AB
Neisseria Gonorrhea: NEGATIVE

## 2015-08-31 ENCOUNTER — Telehealth: Payer: Self-pay | Admitting: *Deleted

## 2017-03-17 ENCOUNTER — Encounter (HOSPITAL_COMMUNITY): Payer: Self-pay | Admitting: Emergency Medicine

## 2017-03-17 ENCOUNTER — Emergency Department (HOSPITAL_COMMUNITY)
Admission: EM | Admit: 2017-03-17 | Discharge: 2017-03-17 | Disposition: A | Discharge status: Home Health | Payer: No Typology Code available for payment source | Attending: Emergency Medicine | Admitting: Emergency Medicine

## 2017-03-17 DIAGNOSIS — Z79899 Other long term (current) drug therapy: Secondary | ICD-10-CM | POA: Insufficient documentation

## 2017-03-17 DIAGNOSIS — Y92410 Unspecified street and highway as the place of occurrence of the external cause: Secondary | ICD-10-CM | POA: Diagnosis not present

## 2017-03-17 DIAGNOSIS — Y998 Other external cause status: Secondary | ICD-10-CM | POA: Diagnosis not present

## 2017-03-17 DIAGNOSIS — S39012A Strain of muscle, fascia and tendon of lower back, initial encounter: Secondary | ICD-10-CM

## 2017-03-17 DIAGNOSIS — S3992XA Unspecified injury of lower back, initial encounter: Secondary | ICD-10-CM | POA: Diagnosis present

## 2017-03-17 DIAGNOSIS — F17210 Nicotine dependence, cigarettes, uncomplicated: Secondary | ICD-10-CM | POA: Diagnosis not present

## 2017-03-17 DIAGNOSIS — Y939 Activity, unspecified: Secondary | ICD-10-CM | POA: Insufficient documentation

## 2017-03-17 MED ORDER — CYCLOBENZAPRINE HCL 10 MG PO TABS: 10.0000 mg | ORAL_TABLET | Freq: Three times a day (TID) | ORAL | 0 refills | Status: DC | PRN

## 2017-03-17 MED ORDER — IBUPROFEN 800 MG PO TABS: 800.0000 mg | ORAL_TABLET | Freq: Three times a day (TID) | ORAL | 0 refills | Status: DC
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
## 2017-03-17 NOTE — Discharge Instructions (Signed)
Apply ice packs on/off to your lower back.  Try to avoid bending over and heavy lifting for 5-7 days.  Follow-up with your doctor or return here for any worsening symptoms
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
## 2021-04-28 ENCOUNTER — Other Ambulatory Visit (HOSPITAL_COMMUNITY)
Admission: RE | Admit: 2021-04-28 | Discharge: 2021-04-28 | Disposition: A | Discharge status: Home / Self Care | Payer: Commercial Managed Care - PPO | Source: Ambulatory Visit | Attending: Obstetrics & Gynecology | Admitting: Obstetrics & Gynecology

## 2021-04-28 ENCOUNTER — Other Ambulatory Visit: Payer: Self-pay

## 2021-04-28 ENCOUNTER — Ambulatory Visit (INDEPENDENT_AMBULATORY_CARE_PROVIDER_SITE_OTHER): Payer: Commercial Managed Care - PPO | Admitting: Obstetrics & Gynecology

## 2021-04-28 ENCOUNTER — Encounter: Payer: Self-pay | Admitting: Obstetrics & Gynecology

## 2021-04-28 VITALS — BP 128/81 | HR 81 | Ht 63.0 in | Wt 200.0 lb

## 2021-04-28 DIAGNOSIS — Z01419 Encounter for gynecological examination (general) (routine) without abnormal findings: Secondary | ICD-10-CM | POA: Insufficient documentation

## 2021-04-28 DIAGNOSIS — Z30432 Encounter for removal of intrauterine contraceptive device: Secondary | ICD-10-CM

## 2021-04-28 NOTE — Progress Notes (Signed)
Subjective:     [name] is a 30 y.o. female here for a routine exam.  Patient's last menstrual period was 04/15/2021. G3P0 Birth Control Method:  nexplanon Menstrual Calendar(currently): regular  Current complaints: none.   Current acute medical issues:  Bipolar 1   Recent Gynecologic History Patient's last menstrual period was 04/15/2021. Last Pap: unsure,   Last mammogram: na,    Past Medical History:  Diagnosis Date   Bipolar 1 disorder (HCC)    Chronic back pain    Depression     Past Surgical History:  Procedure Laterality Date   addenoidectomy     CESAREAN SECTION     TONSILLECTOMY      OB History     Gravida  3   Para      Term      Preterm      AB      Living  3      SAB      IAB      Ectopic      Multiple      Live Births              Social History   Socioeconomic History   Marital status: Single    Spouse name: Not on file   Number of children: Not on file   Years of education: Not on file   Highest education level: Not on file  Occupational History   Not on file  Tobacco Use   Smoking status: Every Day    Packs/day: 0.25    Years: 3.00    Total pack years: 0.75    Types: Cigarettes   Smokeless tobacco: Not on file  Substance and Sexual Activity   Alcohol use: No   Drug use: No   Sexual activity: Never    Birth control/protection: Implant  Other Topics Concern   Not on file  Social History Narrative   Not on file   Social Determinants of Health   Financial Resource Strain: Medium Risk (04/28/2021)   Overall Financial Resource Strain (CARDIA)    Difficulty of Paying Living Expenses: Somewhat hard  Food Insecurity: Food Insecurity Present (04/28/2021)   Hunger Vital Sign    Worried About Running Out of Food in the Last Year: Sometimes true    Ran Out of Food in the Last Year: Sometimes true  Transportation Needs: No Transportation Needs (04/28/2021)   PRAPARE - Administrator, Civil Service  (Medical): No    Lack of Transportation (Non-Medical): No  Physical Activity: Sufficiently Active (04/28/2021)   Exercise Vital Sign    Days of Exercise per Week: 7 days    Minutes of Exercise per Session: 40 min  Stress: Stress Concern Present (04/28/2021)   [employer] of Occupational Health - Occupational Stress Questionnaire    Feeling of Stress : Very much  Social Connections: Socially Isolated (04/28/2021)   Social Connection and Isolation Panel [NHANES]    Frequency of Communication with Friends and Family: Three times a week    Frequency of Social Gatherings with Friends and Family: Once a week    Attends Religious Services: Never    Database administrator or Organizations: No    Attends Banker Meetings: Never    Marital Status: Never married    History reviewed. No pertinent family history.   Current Outpatient Medications:    etonogestrel (NEXPLANON) 68 MG IMPL implant, Inject 1 each into the skin once., Disp: ,  Rfl:    aspirin-acetaminophen-caffeine (EXCEDRIN MIGRAINE) [id number] MG per tablet, Take 2 tablets by mouth every 8 (eight) hours as needed for headache or migraine. (Patient not taking: Reported on 04/28/2021), Disp: , Rfl:    azithromycin (ZITHROMAX) 250 MG tablet, Take all four tablets on 09/02/15 (Patient not taking: Reported on 04/28/2021), Disp: 4 tablet, Rfl: 0   cyclobenzaprine (FLEXERIL) 10 MG tablet, Take 1 tablet (10 mg total) by mouth 3 (three) times daily as needed. (Patient not taking: Reported on 04/28/2021), Disp: 21 tablet, Rfl: 0   HYDROcodone-acetaminophen (NORCO/VICODIN) 5-325 MG tablet, Take 1 tablet by mouth every 6 (six) hours as needed. (Patient not taking: Reported on 04/28/2021), Disp: 15 tablet, Rfl: 0   ibuprofen (ADVIL,MOTRIN) 800 MG tablet, Take 1 tablet (800 mg total) by mouth 3 (three) times daily. (Patient not taking: Reported on 04/28/2021), Disp: 21 tablet, Rfl: 0   metroNIDAZOLE (FLAGYL) 500 MG tablet, Take 1 tablet (500 mg  total) by mouth 2 (two) times daily. One po bid x 7 days (Patient not taking: Reported on 04/28/2021), Disp: 28 tablet, Rfl: 0  Review of Systems  Review of Systems  Constitutional: Negative for fever, chills, weight loss, malaise/fatigue and diaphoresis.  HENT: Negative for hearing loss, ear pain, nosebleeds, congestion, sore throat, neck pain, tinnitus and ear discharge.   Eyes: Negative for blurred vision, double vision, photophobia, pain, discharge and redness.  Respiratory: Negative for cough, hemoptysis, sputum production, shortness of breath, wheezing and stridor.   Cardiovascular: Negative for chest pain, palpitations, orthopnea, claudication, leg swelling and PND.  Gastrointestinal: negative for abdominal pain. Negative for heartburn, nausea, vomiting, diarrhea, constipation, blood in stool and melena.  Genitourinary: Negative for dysuria, urgency, frequency, hematuria and flank pain.  Musculoskeletal: Negative for myalgias, back pain, joint pain and falls.  Skin: Negative for itching and rash.  Neurological: Negative for dizziness, tingling, tremors, sensory change, speech change, focal weakness, seizures, loss of consciousness, weakness and headaches.  Endo/Heme/Allergies: Negative for environmental allergies and polydipsia. Does not bruise/bleed easily.  Psychiatric/Behavioral: Negative for depression, suicidal ideas, hallucinations, memory loss and substance abuse. The patient is not nervous/anxious and does not have insomnia.        Objective:  Blood pressure 128/81, pulse 81, height 5\' 3"  (1.6 m), weight 200 lb (90.7 kg), last menstrual period 04/15/2021.   Physical Exam  Vitals reviewed. Constitutional: She is oriented to person, place, and time. She appears well-developed and well-nourished.  HENT:  Head: Normocephalic and atraumatic.        Right Ear: External ear normal.  Left Ear: External ear normal.  Nose: Nose normal.  Mouth/Throat: Oropharynx is clear and moist.   Eyes: Conjunctivae and EOM are normal. Pupils are equal, round, and reactive to light. Right eye exhibits no discharge. Left eye exhibits no discharge. No scleral icterus.  Neck: Normal range of motion. Neck supple. No tracheal deviation present. No thyromegaly present.  Cardiovascular: Normal rate, regular rhythm, normal heart sounds and intact distal pulses.  Exam reveals no gallop and no friction rub.   No murmur heard. Respiratory: Effort normal and breath sounds normal. No respiratory distress. She has no wheezes. She has no rales. She exhibits no tenderness.  GI: Soft. Bowel sounds are normal. She exhibits no distension and no mass. There is no tenderness. There is no rebound and no guarding.  Genitourinary:  Breasts no masses skin changes or nipple changes bilaterally      Vulva is normal without lesions Vagina is pink moist without discharge  Cervix normal in appearance and pap is done Uterus is normal size shape and contour Adnexa is negative with normal sized ovaries   Musculoskeletal: Normal range of motion. She exhibits no edema and no tenderness.  Neurological: She is alert and oriented to person, place, and time. She has normal reflexes. She displays normal reflexes. No cranial nerve deficit. She exhibits normal muscle tone. Coordination normal.  Skin: Skin is warm and dry. No rash noted. No erythema. No pallor.  Psychiatric: She has a normal mood and affect. Her behavior is normal. Judgment and thought content normal.       Medications Ordered at today's visit: No orders of the defined types were placed in this encounter.   Other orders placed at today's visit: No orders of the defined types were placed in this encounter.     Assessment:    Normal Gyn exam.    Plan:    Contraception: Nexplanon. Follow up in: 3 years.     Return in about 3 years (around 04/28/2024) for yearly.   Nexplanon removal:

## 2021-05-03 LAB — CYTOLOGY - PAP
Chlamydia: NEGATIVE
Comment: NEGATIVE
Comment: NEGATIVE
Comment: NORMAL
Diagnosis: NEGATIVE
High risk HPV: NEGATIVE
Neisseria Gonorrhea: NEGATIVE

## 2023-01-18 ENCOUNTER — Ambulatory Visit: Payer: Commercial Managed Care - PPO | Admitting: Family Medicine

## 2023-02-12 ENCOUNTER — Ambulatory Visit: Payer: Commercial Managed Care - PPO | Admitting: Obstetrics and Gynecology

## 2024-03-31 ENCOUNTER — Ambulatory Visit (INDEPENDENT_AMBULATORY_CARE_PROVIDER_SITE_OTHER): Payer: Self-pay | Admitting: *Deleted

## 2024-03-31 DIAGNOSIS — Z3201 Encounter for pregnancy test, result positive: Secondary | ICD-10-CM | POA: Diagnosis not present

## 2024-03-31 DIAGNOSIS — N912 Amenorrhea, unspecified: Secondary | ICD-10-CM

## 2024-03-31 LAB — POCT URINE PREGNANCY: Preg Test, Ur: POSITIVE — AB

## 2024-03-31 NOTE — Progress Notes (Signed)
 [name] presents today for UPT. She has no unusual complaints.  LMP:02/29/24 EDD: 12/05/24    OBJECTIVE: Appears well, in no apparent distress.  OB History     Gravida  3   Para      Term      Preterm      AB      Living  3      SAB      IAB      Ectopic      Multiple      Live Births             Home UPT Result: Positive In-Office UPT result: Positive  I have reviewed the patient's medical, obstetrical, social, and family histories, and medications.   ASSESSMENT: Positive pregnancy test  PLAN Prenatal care to be completed at: [facility]

## 2024-04-23 ENCOUNTER — Encounter: Payer: Self-pay | Admitting: *Deleted

## 2024-04-28 ENCOUNTER — Other Ambulatory Visit: Payer: Self-pay

## 2024-04-28 ENCOUNTER — Ambulatory Visit: Payer: Self-pay | Admitting: *Deleted

## 2024-04-28 VITALS — BP 128/79 | HR 84 | Ht 65.0 in | Wt 171.1 lb

## 2024-04-28 DIAGNOSIS — Z3A08 8 weeks gestation of pregnancy: Secondary | ICD-10-CM

## 2024-04-28 DIAGNOSIS — O3680X0 Pregnancy with inconclusive fetal viability, not applicable or unspecified: Secondary | ICD-10-CM

## 2024-04-28 DIAGNOSIS — O0990 Supervision of high risk pregnancy, unspecified, unspecified trimester: Secondary | ICD-10-CM

## 2024-04-28 MED ORDER — VITAFOL GUMMIES 3.33-0.333-34.8 MG PO CHEW: 3.0000 | CHEWABLE_TABLET | Freq: Every day | ORAL | 11 refills | Status: AC

## 2024-04-28 MED ORDER — BLOOD PRESSURE KIT DEVI: 1.0000 | 0 refills | Status: AC

## 2024-04-28 NOTE — Patient Instructions (Signed)
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
## 2024-05-01 ENCOUNTER — Inpatient Hospital Stay (HOSPITAL_COMMUNITY)

## 2024-05-01 ENCOUNTER — Inpatient Hospital Stay (HOSPITAL_COMMUNITY)
Admission: AD | Admit: 2024-05-01 | Discharge: 2024-05-01 | Disposition: A | Discharge status: Home / Self Care | Payer: Self-pay | Attending: Obstetrics & Gynecology | Admitting: Obstetrics & Gynecology

## 2024-05-01 DIAGNOSIS — Z3A08 8 weeks gestation of pregnancy: Secondary | ICD-10-CM

## 2024-05-01 DIAGNOSIS — O208 Other hemorrhage in early pregnancy: Secondary | ICD-10-CM | POA: Diagnosis not present

## 2024-05-01 LAB — URINALYSIS, ROUTINE W REFLEX MICROSCOPIC
Bilirubin Urine: NEGATIVE
Glucose, UA: NEGATIVE mg/dL
Ketones, ur: NEGATIVE mg/dL
Leukocytes,Ua: NEGATIVE
Nitrite: POSITIVE — AB
Protein, ur: 100 mg/dL — AB
RBC / HPF: >50 RBC/hpf (ref 0–5)
Specific Gravity, Urine: 1.034 — ABNORMAL HIGH (ref 1.005–1.030)
pH: 5 (ref 5.0–8.0)

## 2024-05-01 NOTE — Discharge Instructions (Addendum)
-   return to the MAU if you have vaginal bleeding that soaks a maxi pad within 2 hours, severe abdominal pain, dizziness, or fever

## 2024-05-01 NOTE — MAU Note (Signed)
..  [name] is a 33 y.o. at [redacted]w[redacted]d here in MAU reporting:around 30 minutes ago started having vaginal bleeding during intercourse and abdominal pain shortly after.   Pain score: 4/10 Vitals:   05/01/24 0254  BP: 133/65  Pulse: 94  Resp: 16  Temp: 98.3 F (36.8 C)  SpO2: 100%     FHT:n/a Lab orders placed from triage:  UA

## 2024-05-01 NOTE — MAU Note (Addendum)
 Registration called stating patient and support person walked out without signing AMA form. Dr. Lalla made aware.

## 2024-05-01 NOTE — MAU Note (Signed)
 Registration called back stating patient has returned. Discharge event cancelled and patient returned to board, Dr. Lalla made aware

## 2024-05-01 NOTE — MAU Provider Note (Signed)
 History     CSN: [id number]  Arrival date and time: 05/01/24 0248 First Provider Initiated Contact with Patient   Chief Complaint  Patient presents with   Vaginal Bleeding   Abdominal Pain    HPI [name] is a 33 y.o. [id number] at [redacted]w[redacted]d, 12/05/2024, by Last Menstrual Period, who presents to the Maternity Assessment Unit for vaginal bleeding.  Patient reports she started bleeding during intercourse at about 0230. Patient went to shower to clean up and noted blood running down her leg and a clot that fell while she was in the shower. Bleeding has continued. She developed abdominal pain after the bleeding started. She describes the pain as along  ROS (+) VB, abd pain (-) dizziness, palpitations   Past Medical History:  Diagnosis Date   Bipolar 1 disorder (HCC)    Chronic back pain    Depression    Past Surgical History:  Procedure Laterality Date   CESAREAN SECTION  2008   CESAREAN SECTION  2009   CESAREAN SECTION  2011   TONSILLECTOMY AND ADENOIDECTOMY Bilateral 2002   TYMPANOSTOMY TUBE PLACEMENT Bilateral 1991   No Known Allergies  Physical Exam  BP 133/65 (BP Location: Right Arm)   Pulse 94   Temp 98.3 F (36.8 C) (Oral)   Resp 16   Ht 5' 5 (1.651 m)   Wt 77.3 kg   LMP 02/29/2024 (Exact Date)   SpO2 100%   BMI 28.36 kg/m   Gen: alert, no acute distress CV: regular rate Resp: nonlabored Abd: tender lower quadrants, worst LLQ. No guarding or rebound.   Labs     Results for orders placed or performed during the hospital encounter of 05/01/24 (from the past 24 hours)  Urinalysis, Routine w reflex microscopic -Urine, Clean Catch     Status: Abnormal   Collection Time: 05/01/24  6:18 AM  Result Value Ref Range   Color, Urine AMBER (A) YELLOW   APPearance CLOUDY (A) CLEAR   Specific Gravity, Urine 1.034 (H) 1.005 - 1.030   pH 5.0 5.0 - 8.0   Glucose, UA NEGATIVE NEGATIVE mg/dL   Hgb urine dipstick LARGE (A) NEGATIVE   Bilirubin Urine NEGATIVE  NEGATIVE   Ketones, ur NEGATIVE NEGATIVE mg/dL   Protein, ur 899 (A) NEGATIVE mg/dL   Nitrite POSITIVE (A) NEGATIVE   Leukocytes,Ua NEGATIVE NEGATIVE   RBC / HPF >50 0 - 5 RBC/hpf   WBC, UA 0-5 0 - 5 WBC/hpf   Bacteria, UA MANY (A) NONE SEEN   Squamous Epithelial / HPF 0-5 0 - 5 /HPF   Mucus PRESENT     Imaging US  OB Comp Less 14 Wks with OB Transvaginal IMPRESSION: 1. Single intrauterine gestational sac, yolk sac, and embryo with cardiac activity (heart rate 167 bpm). 2. Small subchorionic hemorrhage. 3. Gestational age by crown-rump length is 9 weeks 0 days, consistent with gestational age by last menstrual period of 8 weeks 6 days.  Electronically signed by: Waddell Calk MD   Assessment and Plan  MDM [name] is a 33 y.o. [phone] at [redacted]w[redacted]d, 12/05/2024, by Last Menstrual Period, who presents to the MAU for vaginal bleeding. Ddx: 1st trimester vaginal bleeding includes but is not limited to: ectopic pregnancy, complete spontaneous abortion, incomplete abortion, missed abortion, threatened abortion, embryonic/fetal demise, cervical insufficiency, cervical or vaginal disorder, subchorionic hemorrhage.   Orders Placed This Encounter  Procedures   US  OB Comp Less 14 Wks   Urinalysis, Routine w reflex microscopic -Urine, Clean Catch  Discharge patient   No orders of the defined types were placed in this encounter.  US  ordered today for VB, dating measurements also performed. US  report from 12/1 dating US  not available. [facility] aware of the problem.   No still images available (cine only) for comparison and report not available for US  from 3 days ago, so unclear whether [facility] is new. Cine reviewed, IUP noted, thus did not pursue full CUBA workup.   Pt feeling reassured after US  with [facility].  Do not suspect UTI or vaginitis as cause of abd pain given that it started after the bleeding. UA abnormal and specimen sent for culture. No ectopic (heterotopic) pregnancy  notice on tonight's ultrasound.    [redacted] weeks gestation of pregnancy Subchorionic hemorrhage of placenta in first trimester  Results pending at the time of DC: Ucx Dispo: DC home in stable condition with return precautions discussed and included in AVS.    Barabara Maier, DO FM-OB Fellow [facility] for [employer]

## 2024-05-03 LAB — CULTURE, OB URINE

## 2024-05-03 LAB — URINE CULTURE, OB REFLEX

## 2024-05-04 LAB — CULTURE, OB URINE: Culture: >=100000 — AB

## 2024-05-05 ENCOUNTER — Ambulatory Visit: Payer: Self-pay

## 2024-05-05 ENCOUNTER — Ambulatory Visit: Payer: Self-pay | Admitting: Obstetrics and Gynecology

## 2024-05-05 MED ORDER — CEFADROXIL 500 MG PO CAPS: 500.0000 mg | ORAL_CAPSULE | Freq: Two times a day (BID) | ORAL | 0 refills | Status: DC

## 2024-05-14 ENCOUNTER — Other Ambulatory Visit (HOSPITAL_COMMUNITY)
Admission: RE | Admit: 2024-05-14 | Discharge: 2024-05-14 | Disposition: A | Source: Ambulatory Visit | Attending: Obstetrics and Gynecology | Admitting: Obstetrics and Gynecology

## 2024-05-14 ENCOUNTER — Ambulatory Visit: Payer: Self-pay | Admitting: Obstetrics and Gynecology

## 2024-05-14 ENCOUNTER — Encounter: Payer: Self-pay | Admitting: Obstetrics and Gynecology

## 2024-05-14 VITALS — BP 127/72 | HR 85 | Wt 176.2 lb

## 2024-05-14 DIAGNOSIS — O0991 Supervision of high risk pregnancy, unspecified, first trimester: Secondary | ICD-10-CM | POA: Diagnosis not present

## 2024-05-14 DIAGNOSIS — Z8751 Personal history of pre-term labor: Secondary | ICD-10-CM | POA: Diagnosis not present

## 2024-05-14 DIAGNOSIS — Z98891 History of uterine scar from previous surgery: Secondary | ICD-10-CM | POA: Diagnosis not present

## 2024-05-14 DIAGNOSIS — O208 Other hemorrhage in early pregnancy: Secondary | ICD-10-CM | POA: Diagnosis not present

## 2024-05-14 DIAGNOSIS — R8271 Bacteriuria: Secondary | ICD-10-CM

## 2024-05-14 DIAGNOSIS — Z3A10 10 weeks gestation of pregnancy: Secondary | ICD-10-CM | POA: Diagnosis not present

## 2024-05-14 DIAGNOSIS — O0990 Supervision of high risk pregnancy, unspecified, unspecified trimester: Secondary | ICD-10-CM

## 2024-05-14 NOTE — Progress Notes (Signed)
 INITIAL PRENATAL VISIT  Subjective:   [name] is being seen today for her first obstetrical visit.  She is at [redacted]w[redacted]d gestation by LMP. Her obstetrical history is significant for hx of cesarean section x 3. Patient does intend to breast feed. Pregnancy history fully reviewed.  Patient reports no complaints.  Pap smear history: 04/28/2021 NILM Objective:    Obstetric History OB History  Gravida Para Term Preterm AB Living  4 3 2 1  0 3  SAB IAB Ectopic Multiple Live Births  0 0 0 0 3    # Outcome Date GA Lbr Len/2nd Weight Sex Type Anes PTL Lv  4 Current           3 Term 04/04/10    F CS-LTranv Spinal  LIV  2 Preterm 01/29/08 [redacted]w[redacted]d   F CS-LTranv EPI  LIV     Complications: Fetal Intolerance  1 Term 04/18/07    F CS-LTranv EPI  LIV     Complications: Fetal Intolerance    Past Medical History:  Diagnosis Date   Bipolar 1 disorder (HCC)    Chronic back pain    Depression     Past Surgical History:  Procedure Laterality Date   CESAREAN SECTION  2008   CESAREAN SECTION  2009   CESAREAN SECTION  2011   TONSILLECTOMY AND ADENOIDECTOMY Bilateral 2002   TYMPANOSTOMY TUBE PLACEMENT Bilateral 1991    Medications Ordered Prior to Encounter[1]  Allergies[2]  Social History:  reports that she has quit smoking. Her smoking use included cigarettes. She has a 0.8 pack-year smoking history. She does not have any smokeless tobacco history on file. She reports that she does not drink alcohol and does not use drugs.  Family History  Problem Relation Age of Onset   Diabetes Mother    Mental illness Mother    Hypertension Father     The following portions of the patient's history were reviewed and updated as appropriate: allergies, current medications, past family history, past medical history, past social history, past surgical history and problem list.  Review of Systems Review of Systems  All other systems reviewed and are negative.   Physical Exam:  BP 127/72    Pulse 85   Wt 176 lb 3.2 oz (79.9 kg)   LMP 02/29/2024 (Exact Date)   BMI 29.32 kg/m  CONSTITUTIONAL: Well-developed, well-nourished female in no acute distress.  HENT:  Normocephalic, atraumatic.   SKIN: Skin is warm and dry. MUSCULOSKELETAL: Normal range of motion NEUROLOGIC: Alert and oriented  PSYCHIATRIC: Normal mood and affect. Normal behavior.  CARDIOVASCULAR: Normal heart rate noted RESPIRATORY: normal effort ABDOMEN: Soft PELVIC:Pelvic: normal appearing vulva with no masses, tenderness or lesions  VAGINA: normal appearing vagina with normal color and discharge, no lesions  CERVIX: normal appearing cervix without discharge or lesions, no CMT  Thin prep pap is done w HR HPV cotesting  Extremities:  No swelling or varicosities noted   Fetal Heart Rate (bpm): Cardiac activity on bedside u/s   Movement: Absent       Assessment:    Pregnancy: H5E7896  1. Supervision of high risk pregnancy, antepartum (Primary) BP and cardiac activity visualized on bedside ultrasound  Discussed recommendation for ASA, would like discuss next visit   - Cytology - PAP( [facility]) - Cervicovaginal ancillary only( ) - CBC/D/Plt+RPR+Rh+ABO+RubIgG... - HgB A1c - PANORAMA PRENATAL TEST - HORIZON Basic Panel  2. History of cesarean section x 3 First baby fetal intolerance at full term  Second baby PTD and fetal intolerance at 34 weeks  Third baby RCS   3. History of preterm delivery  Second baby PTL 34 weeks, tried to labor, fetal intolerance   5. Asymptomatic bacteriuria Completed abx, repeat culture today  - Culture, OB Urine  6. Subchorionic hemorrhage of placenta in first trimester Discussed dx, no bleeding currently, precautions discussed when to follow up  Follow up anatomy scan February     Plan:     Initial labs drawn. Prenatal vitamins. Problem list reviewed and updated. Reviewed in detail the nature of the practice with collaborative care between  Genetic  screening discussed: NIPS/First trimester screen/Quad/AFP ordered. Role of ultrasound in pregnancy discussed; Anatomy US : ordered. Weight gain recommendations per IOM guidelines reviewed: underweight/BMI 18.5 or less > 28 - 40 lbs; normal weight/BMI 18.5 - 24.9 > 25 - 35 lbs; overweight/BMI 25 - 29.9 > 15 - 25 lbs; obese/BMI  30 or more > 11 - 20 lbs.  Discussed clinic routines, schedule of care and testing, genetic screening options, involvement of students and residents under the direct supervision of APPs and doctors and presence of female providers. Pt verbalized understanding.  Return in 4 weeks OB visit   Amy Nidia CROME, FNP       [1]  Current Outpatient Medications on File Prior to Visit  Medication Sig Dispense Refill   Blood Pressure Monitoring (BLOOD PRESSURE KIT) DEVI 1 Device by Does not apply route once a week. 1 each 0   Prenatal Vit-Fe Phos-FA-Omega (VITAFOL  GUMMIES) 3.33-0.333-34.8 MG CHEW Chew 3 tablets by mouth daily. 90 tablet 11   cefadroxil  (DURICEF) 500 MG capsule Take 1 capsule (500 mg total) by mouth 2 (two) times daily. (Patient not taking: Reported on 05/14/2024) 14 capsule 0   No current facility-administered medications on file prior to visit.  [2] No Known Allergies

## 2024-05-14 NOTE — Progress Notes (Signed)
 Pt presents for ROB visit. No bleeding since MAU visit. No concerns

## 2024-05-16 ENCOUNTER — Ambulatory Visit: Payer: Self-pay | Admitting: Obstetrics and Gynecology

## 2024-05-16 ENCOUNTER — Encounter: Payer: Self-pay | Admitting: Obstetrics and Gynecology

## 2024-05-16 DIAGNOSIS — Z148 Genetic carrier of other disease: Secondary | ICD-10-CM

## 2024-05-16 DIAGNOSIS — A5901 Trichomonal vulvovaginitis: Secondary | ICD-10-CM | POA: Insufficient documentation

## 2024-05-16 DIAGNOSIS — O0990 Supervision of high risk pregnancy, unspecified, unspecified trimester: Secondary | ICD-10-CM

## 2024-05-16 DIAGNOSIS — O2340 Unspecified infection of urinary tract in pregnancy, unspecified trimester: Secondary | ICD-10-CM

## 2024-05-16 DIAGNOSIS — O98119 Syphilis complicating pregnancy, unspecified trimester: Secondary | ICD-10-CM | POA: Insufficient documentation

## 2024-05-16 DIAGNOSIS — D563 Thalassemia minor: Secondary | ICD-10-CM

## 2024-05-16 LAB — CBC/D/PLT+RPR+RH+ABO+RUBIGG...
Antibody Screen: NEGATIVE
Basophils Absolute: 0.1 x10E3/uL (ref 0.0–0.2)
Basos: 1 %
EOS (ABSOLUTE): 0.2 x10E3/uL (ref 0.0–0.4)
Eos: 3 %
HCV Ab: NONREACTIVE
HIV Screen 4th Generation wRfx: NONREACTIVE
Hematocrit: 37.4 % (ref 34.0–46.6)
Hemoglobin: 11.8 g/dL (ref 11.1–15.9)
Hepatitis B Surface Ag: NEGATIVE
Immature Grans (Abs): 0 x10E3/uL (ref 0.0–0.1)
Immature Granulocytes: 0 %
Lymphocytes Absolute: 1.7 x10E3/uL (ref 0.7–3.1)
Lymphs: 24 %
MCH: 27.8 pg (ref 26.6–33.0)
MCHC: 31.6 g/dL (ref 31.5–35.7)
MCV: 88 fL (ref 79–97)
Monocytes Absolute: 0.5 x10E3/uL (ref 0.1–0.9)
Monocytes: 7 %
Neutrophils Absolute: 4.6 x10E3/uL (ref 1.4–7.0)
Neutrophils: 65 %
Platelets: 313 x10E3/uL (ref 150–450)
RBC: 4.24 x10E6/uL (ref 3.77–5.28)
RDW: 12.3 % (ref 11.7–15.4)
RPR Ser Ql: REACTIVE — AB
Rh Factor: POSITIVE
Rubella Antibodies, IGG: 2.38 {index}
WBC: 7.1 x10E3/uL (ref 3.4–10.8)

## 2024-05-16 LAB — CERVICOVAGINAL ANCILLARY ONLY
Chlamydia: NEGATIVE
Comment: NEGATIVE
Comment: NEGATIVE
Comment: NORMAL
Neisseria Gonorrhea: NEGATIVE
Trichomonas: POSITIVE — AB

## 2024-05-16 LAB — RPR, QUANT+TP ABS (REFLEX)
Rapid Plasma Reagin, Quant: 1:32 {titer} — ABNORMAL HIGH
T Pallidum Abs: REACTIVE — AB

## 2024-05-16 LAB — HEMOGLOBIN A1C
Est. average glucose Bld gHb Est-mCnc: 91 mg/dL
Hgb A1c MFr Bld: 4.8 % (ref 4.8–5.6)

## 2024-05-16 LAB — HCV INTERPRETATION

## 2024-05-16 MED ORDER — METRONIDAZOLE 500 MG PO TABS: 500.0000 mg | ORAL_TABLET | Freq: Two times a day (BID) | ORAL | 0 refills | Status: AC

## 2024-05-17 LAB — URINE CULTURE, OB REFLEX

## 2024-05-17 LAB — CULTURE, OB URINE

## 2024-05-20 LAB — CYTOLOGY - PAP
Comment: NEGATIVE
Diagnosis: NEGATIVE
High risk HPV: NEGATIVE

## 2024-05-23 LAB — PANORAMA PRENATAL TEST FULL PANEL:PANORAMA TEST PLUS 5 ADDITIONAL MICRODELETIONS: FETAL FRACTION: 9.5

## 2024-05-26 DIAGNOSIS — O2340 Unspecified infection of urinary tract in pregnancy, unspecified trimester: Secondary | ICD-10-CM | POA: Insufficient documentation

## 2024-05-26 MED ORDER — FOSFOMYCIN TROMETHAMINE 3 G PO PACK: 3.0000 g | PACK | Freq: Once | ORAL | 0 refills | Status: AC

## 2024-05-29 LAB — HORIZON CUSTOM: REPORT SUMMARY: POSITIVE — AB

## 2024-05-30 DIAGNOSIS — Z148 Genetic carrier of other disease: Secondary | ICD-10-CM | POA: Insufficient documentation

## 2024-05-30 DIAGNOSIS — D563 Thalassemia minor: Secondary | ICD-10-CM | POA: Insufficient documentation

## 2024-06-03 DIAGNOSIS — D563 Thalassemia minor: Secondary | ICD-10-CM

## 2024-06-03 DIAGNOSIS — Z148 Genetic carrier of other disease: Secondary | ICD-10-CM

## 2024-06-03 NOTE — Telephone Encounter (Signed)
 Patient is aware of Horizon results.  Declines genetic counseling.  Erminio DELENA Rumps, RN

## 2024-06-03 NOTE — Progress Notes (Signed)
 2 RNs were working on this result at the same time. Referral placed in error.

## 2024-06-03 NOTE — Telephone Encounter (Signed)
-----   Message from [facility] sent at 05/30/2024 12:29 PM EST ----- Can we offer genetic counseling if she desires

## 2024-06-03 NOTE — Progress Notes (Signed)
 Amb ref to MFM genetics: Silent Carrier for Alpha-Thalassemia and Carrier for Spinal Muscular Atrophy.

## 2024-06-03 NOTE — Progress Notes (Signed)
"  Referral placed   "

## 2024-06-11 ENCOUNTER — Encounter: Payer: Self-pay | Admitting: Obstetrics & Gynecology

## 2024-06-12 ENCOUNTER — Encounter: Admitting: Obstetrics and Gynecology

## 2024-06-12 ENCOUNTER — Encounter: Payer: Self-pay | Admitting: Obstetrics and Gynecology

## 2024-06-12 VITALS — BP 114/71 | HR 80 | Wt 173.0 lb

## 2024-06-12 DIAGNOSIS — O23592 Infection of other part of genital tract in pregnancy, second trimester: Secondary | ICD-10-CM | POA: Diagnosis not present

## 2024-06-12 DIAGNOSIS — Z148 Genetic carrier of other disease: Secondary | ICD-10-CM

## 2024-06-12 DIAGNOSIS — O98119 Syphilis complicating pregnancy, unspecified trimester: Secondary | ICD-10-CM

## 2024-06-12 DIAGNOSIS — A5901 Trichomonal vulvovaginitis: Secondary | ICD-10-CM

## 2024-06-12 DIAGNOSIS — Z3A14 14 weeks gestation of pregnancy: Secondary | ICD-10-CM | POA: Diagnosis not present

## 2024-06-12 DIAGNOSIS — F339 Major depressive disorder, recurrent, unspecified: Secondary | ICD-10-CM | POA: Diagnosis not present

## 2024-06-12 DIAGNOSIS — O23599 Infection of other part of genital tract in pregnancy, unspecified trimester: Secondary | ICD-10-CM

## 2024-06-12 DIAGNOSIS — O0990 Supervision of high risk pregnancy, unspecified, unspecified trimester: Secondary | ICD-10-CM

## 2024-06-12 DIAGNOSIS — Z98891 History of uterine scar from previous surgery: Secondary | ICD-10-CM | POA: Diagnosis not present

## 2024-06-12 DIAGNOSIS — D563 Thalassemia minor: Secondary | ICD-10-CM

## 2024-06-12 DIAGNOSIS — Z87898 Personal history of other specified conditions: Secondary | ICD-10-CM

## 2024-06-12 DIAGNOSIS — O99891 Other specified diseases and conditions complicating pregnancy: Secondary | ICD-10-CM | POA: Diagnosis not present

## 2024-06-12 DIAGNOSIS — R8271 Bacteriuria: Secondary | ICD-10-CM | POA: Diagnosis not present

## 2024-06-12 DIAGNOSIS — Z9151 Personal history of suicidal behavior: Secondary | ICD-10-CM | POA: Diagnosis not present

## 2024-06-12 DIAGNOSIS — O0992 Supervision of high risk pregnancy, unspecified, second trimester: Secondary | ICD-10-CM

## 2024-06-12 DIAGNOSIS — O98112 Syphilis complicating pregnancy, second trimester: Secondary | ICD-10-CM

## 2024-06-12 NOTE — Patient Instructions (Addendum)
 [facility]  [address], [place], [place] [address] [phone] or [phone]   [facility] 24/7 FOR ANYONE  [address], [place], [place]  [phone]  Fax: [phone] [email or web address]   *Accepts all insurance and uninsured for Urgent Care needs   Temporary Shelter:  [employer] [email or web address]   Help finding housing: [email or web address]  toppreference.be    -- Based in [place], might be too far away
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
## 2024-06-17 ENCOUNTER — Ambulatory Visit (INDEPENDENT_AMBULATORY_CARE_PROVIDER_SITE_OTHER): Payer: Self-pay | Admitting: Licensed Clinical Social Worker

## 2024-06-17 DIAGNOSIS — F332 Major depressive disorder, recurrent severe without psychotic features: Secondary | ICD-10-CM
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
## 2024-06-24 ENCOUNTER — Ambulatory Visit: Payer: Self-pay | Admitting: Licensed Clinical Social Worker

## 2024-06-24 DIAGNOSIS — F332 Major depressive disorder, recurrent severe without psychotic features: Secondary | ICD-10-CM
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
## 2024-06-26 ENCOUNTER — Encounter (HOSPITAL_COMMUNITY): Payer: Self-pay

## 2024-07-01 ENCOUNTER — Encounter: Payer: Self-pay | Admitting: Obstetrics and Gynecology

## 2024-07-02 ENCOUNTER — Ambulatory Visit: Payer: Self-pay | Admitting: Licensed Clinical Social Worker

## 2024-07-02 DIAGNOSIS — F332 Major depressive disorder, recurrent severe without psychotic features: Secondary | ICD-10-CM
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
## 2024-07-03 DIAGNOSIS — O09899 Supervision of other high risk pregnancies, unspecified trimester: Secondary | ICD-10-CM | POA: Insufficient documentation

## 2024-07-08 ENCOUNTER — Encounter: Payer: Self-pay | Admitting: Certified Nurse Midwife

## 2024-07-09 ENCOUNTER — Encounter: Payer: Self-pay | Admitting: Licensed Clinical Social Worker

## 2024-07-10 ENCOUNTER — Encounter: Admitting: Physician Assistant

## 2024-07-17 ENCOUNTER — Other Ambulatory Visit

## 2024-07-17 ENCOUNTER — Ambulatory Visit

## 2024-07-17 DIAGNOSIS — O09899 Supervision of other high risk pregnancies, unspecified trimester: Secondary | ICD-10-CM
